# Patient Record
Sex: Female | Born: 1985 | Race: White | Hispanic: No | Marital: Married | Smoking: Never smoker
Health system: Southern US, Community
[De-identification: ages and names within clinical notes are randomized; demographics above are authoritative.]

## PROBLEM LIST (undated history)

## (undated) DIAGNOSIS — G4711 Idiopathic hypersomnia with long sleep time: Secondary | ICD-10-CM

## (undated) DIAGNOSIS — F32A Depression, unspecified: Secondary | ICD-10-CM

## (undated) DIAGNOSIS — T7840XA Allergy, unspecified, initial encounter: Secondary | ICD-10-CM

## (undated) DIAGNOSIS — F909 Attention-deficit hyperactivity disorder, unspecified type: Secondary | ICD-10-CM

## (undated) DIAGNOSIS — F329 Major depressive disorder, single episode, unspecified: Secondary | ICD-10-CM

## (undated) DIAGNOSIS — F419 Anxiety disorder, unspecified: Secondary | ICD-10-CM

## (undated) DIAGNOSIS — E282 Polycystic ovarian syndrome: Secondary | ICD-10-CM

## (undated) HISTORY — DX: Idiopathic hypersomnia with long sleep time: G47.11

## (undated) HISTORY — DX: Attention-deficit hyperactivity disorder, unspecified type: F90.9

## (undated) HISTORY — DX: Depression, unspecified: F32.A

## (undated) HISTORY — DX: Allergy, unspecified, initial encounter: T78.40XA

## (undated) HISTORY — DX: Polycystic ovarian syndrome: E28.2

## (undated) HISTORY — DX: Major depressive disorder, single episode, unspecified: F32.9

## (undated) HISTORY — DX: Anxiety disorder, unspecified: F41.9

---

## 2007-02-23 HISTORY — PX: APPENDECTOMY: SHX54

## 2008-05-22 HISTORY — PX: TONSILLECTOMY AND ADENOIDECTOMY: SHX28

## 2014-02-09 DIAGNOSIS — F429 Obsessive-compulsive disorder, unspecified: Secondary | ICD-10-CM | POA: Insufficient documentation

## 2016-04-21 NOTE — L&D Delivery Note (Addendum)
Delivery Note Pt pushed for just under an hour. At 1:10 PM a viable female was delivered via Vaginal, Spontaneous (Presentation:ROA ;  ).  APGAR: 8, 9; weight pending .   Placenta status:delivered, intact, duncan , .  Cord:3vc  with the following complications: nuchal x 1.  Cord pH: n/a  Anesthesia: Epidural  Episiotomy: None Lacerations: 1st degree Suture Repair: 2.0 vicryl Est. Blood Loss (mL): 200  Mom to postpartum.  Baby to Couplet care / Skin to Skin. Does not desire circumcision  Cathrine MusterCecilia W Harrison 04/11/2017, 1:36 PM

## 2016-07-31 DIAGNOSIS — Z31430 Encounter of female for testing for genetic disease carrier status for procreative management: Secondary | ICD-10-CM | POA: Diagnosis not present

## 2016-07-31 DIAGNOSIS — Z3161 Procreative counseling and advice using natural family planning: Secondary | ICD-10-CM | POA: Diagnosis not present

## 2016-07-31 DIAGNOSIS — E282 Polycystic ovarian syndrome: Secondary | ICD-10-CM | POA: Diagnosis not present

## 2016-08-12 DIAGNOSIS — Z3200 Encounter for pregnancy test, result unknown: Secondary | ICD-10-CM | POA: Diagnosis not present

## 2016-08-21 DIAGNOSIS — N939 Abnormal uterine and vaginal bleeding, unspecified: Secondary | ICD-10-CM | POA: Diagnosis not present

## 2016-08-21 DIAGNOSIS — O26859 Spotting complicating pregnancy, unspecified trimester: Secondary | ICD-10-CM | POA: Diagnosis not present

## 2016-09-05 DIAGNOSIS — O0900 Supervision of pregnancy with history of infertility, unspecified trimester: Secondary | ICD-10-CM | POA: Diagnosis not present

## 2016-09-05 DIAGNOSIS — R5383 Other fatigue: Secondary | ICD-10-CM | POA: Diagnosis not present

## 2016-09-24 DIAGNOSIS — O99345 Other mental disorders complicating the puerperium: Secondary | ICD-10-CM | POA: Diagnosis not present

## 2016-09-24 DIAGNOSIS — Z3689 Encounter for other specified antenatal screening: Secondary | ICD-10-CM | POA: Diagnosis not present

## 2016-09-24 DIAGNOSIS — Z3A10 10 weeks gestation of pregnancy: Secondary | ICD-10-CM | POA: Diagnosis not present

## 2016-09-24 DIAGNOSIS — Z113 Encounter for screening for infections with a predominantly sexual mode of transmission: Secondary | ICD-10-CM | POA: Diagnosis not present

## 2016-09-24 LAB — OB RESULTS CONSOLE RUBELLA ANTIBODY, IGM: RUBELLA: IMMUNE

## 2016-09-24 LAB — OB RESULTS CONSOLE HIV ANTIBODY (ROUTINE TESTING): HIV: NONREACTIVE

## 2016-09-24 LAB — OB RESULTS CONSOLE GC/CHLAMYDIA
Chlamydia: NEGATIVE
GC PROBE AMP, GENITAL: NEGATIVE

## 2016-09-24 LAB — OB RESULTS CONSOLE ANTIBODY SCREEN: ANTIBODY SCREEN: NEGATIVE

## 2016-09-24 LAB — OB RESULTS CONSOLE ABO/RH: RH Type: NEGATIVE

## 2016-09-24 LAB — OB RESULTS CONSOLE RPR: RPR: NONREACTIVE

## 2016-09-24 LAB — OB RESULTS CONSOLE HEPATITIS B SURFACE ANTIGEN: Hepatitis B Surface Ag: NEGATIVE

## 2016-10-07 DIAGNOSIS — Z3A12 12 weeks gestation of pregnancy: Secondary | ICD-10-CM | POA: Diagnosis not present

## 2016-10-07 DIAGNOSIS — Z3682 Encounter for antenatal screening for nuchal translucency: Secondary | ICD-10-CM | POA: Diagnosis not present

## 2016-10-07 DIAGNOSIS — O200 Threatened abortion: Secondary | ICD-10-CM | POA: Diagnosis not present

## 2016-10-07 MED FILL — FLUoxetine HCL 40 MG CAPS: 40 | 90 days supply | Qty: 90 | Fill #0

## 2016-10-30 ENCOUNTER — Ambulatory Visit: Payer: 59 | Attending: Obstetrics and Gynecology | Admitting: Physical Therapy

## 2016-10-30 DIAGNOSIS — R293 Abnormal posture: Secondary | ICD-10-CM | POA: Diagnosis not present

## 2016-10-30 DIAGNOSIS — M545 Low back pain: Secondary | ICD-10-CM | POA: Diagnosis not present

## 2016-10-30 DIAGNOSIS — M6281 Muscle weakness (generalized): Secondary | ICD-10-CM | POA: Insufficient documentation

## 2016-10-30 NOTE — Progress Notes (Signed)
Patient presents to clinic today to establish care.  Acute Concerns: Patient currently noting fatigue. Is [redacted] weeks pregnant currently and followed by OB. Is taking prenatal vitamins as directed. Is eating a well-balanced diet.  Notes she could hydrate better. Notes history of b12 and Vitamin D deficiency. Denies history of anemia. Would like assessment of these levels.   Chronic Issues: Depression -- Is currently on Prozac 40 mg daily. Has been on this regimen since 2014. Endorses symptoms are well-controlled on this regimen.  Denies SI/HI.   Health Maintenance: Immunizations -- Unsure of last Tetanus. Will be getting a TDaP in the next 2 weeks.  Mammogram -- PAP -- up-to-date. Patient is currently pregnant - 16 weeks. [place]N in [place]   Past Medical History:  Diagnosis Date  . ADHD (attention deficit hyperactivity disorder)   . Allergy   . Depression   . Hypersomnia, idiopathic    Past Surgical History:  Procedure Laterality Date  . APPENDECTOMY  02/23/2007  . TONSILLECTOMY AND ADENOIDECTOMY  05/22/2008   No current outpatient prescriptions on file prior to visit.   No current facility-administered medications on file prior to visit.    No Known Allergies  Family History  Problem Relation Age of Onset  . Hyperlipidemia Father   . Hypothyroidism Father   . Heart block Son   . Heart attack Maternal Uncle   . Heart attack Paternal Uncle   . Cancer Maternal Grandmother        Breast  . Cancer Maternal Grandfather        Stomach  . Hyperlipidemia Paternal Grandmother   . Diabetes Paternal Grandmother   . Heart block Paternal Grandmother   . Hypothyroidism Paternal Grandmother   . Cancer Paternal Grandfather        Kidney    Social History   Social History  . Marital status: Married    Spouse name: Trinna Postlex  . Number of children: N/A  . Years of education: N/A   Occupational History  . MD [employer]   Social History Main Topics  .  Smoking status: Never Smoker  . Smokeless tobacco: Never Used  . Alcohol use No  . Drug use: No  . Sexual activity: Yes   Other Topics Concern  . Not on file   Social History Narrative  . No narrative on file   Review of Systems  Constitutional: Positive for malaise/fatigue.  HENT: Negative for hearing loss.   Eyes: Negative for blurred vision and double vision.  Respiratory: Negative for cough and shortness of breath.   Cardiovascular: Negative for chest pain and palpitations.  Neurological: Negative for loss of consciousness.  Psychiatric/Behavioral: Positive for depression. Negative for hallucinations, substance abuse and suicidal ideas. The patient is not nervous/anxious and does not have insomnia.    BP (!) 100/52   Pulse 75   Temp 98.9 F (37.2 C) (Oral)   Resp 14   Ht 5\' 7"  (1.702 m)   Wt 128 lb (58.1 kg)   LMP 06/19/2016   SpO2 99%   BMI 20.05 kg/m   Physical Exam  Constitutional: She is oriented to person, place, and time and well-developed, well-nourished, and in no distress.  HENT:  Head: Normocephalic and atraumatic.  Eyes: Conjunctivae are normal.  Neck: Neck supple.  Cardiovascular: Normal rate, regular rhythm, normal heart sounds and intact distal pulses.   Pulmonary/Chest: Effort normal and breath sounds normal. No respiratory distress. She has no wheezes. She has no rales. She exhibits  no tenderness.  Neurological: She is alert and oriented to person, place, and time.  Skin: Skin is warm and dry. No rash noted.  Psychiatric: Affect normal.  Vitals reviewed.  Assessment/Plan: 1. Other fatigue Lab panel today to further assess. Continue care as directed by OB. - CBC - Iron, TIBC and Ferritin Panel - B12 - Vitamin D (25 hydroxy)  2. History of vitamin D deficiency Labs today to assess.    Piedad Climes, PA-C

## 2016-10-30 NOTE — Patient Instructions (Signed)
    Belly Breathing Transverse Abdominus Activation- Quadruped  On hands and knees with spine in neutral, take in a big breath allowing the belly to expand downward toward floor. As you exhale, feel the elastic recoil of your belly and follow it by drawing your belly up to your spine. Your spine should not move while performing.    HIP ADDUCTION SQUEEZE - SUPINE  Place a rolled up towel, ball or pillow between your knees and press your knees together so that you squeeze the object firmly, left pelvic floor and engage abdominals. Hold 5 sec and then release and repeat, 20x   Brace abdominals and march arms and legs     Press up to forearms or straight arms if comfortable - hold 10 sec repeat 5 x   Posture - Standing   Good posture is important. Avoid slouching and forward head thrust. Maintain curve in low back and align ears over shoulders, hips over ankles.  Pull your belly button in toward your back bone. Posture Tips DO: - stand tall and erect - keep chin tucked in - keep head and shoulders in alignment - check posture regularly in mirror or large window - pull head back against headrest in car seat;  Change your position often.  Sit with lumbar support. DON'T: - slouch or slump while watching TV or reading - sit, stand or lie in one position  for too long;  Sitting is especially hard on the spine so if you sit at a desk/use the computer, then stand up often! Copyright  VHI. All rights reserved.  Posture - Sitting  Sit upright, head facing forward. Try using a roll to support lower back. Keep shoulders relaxed, and avoid rounded back. Keep hips level with knees. Avoid crossing legs for long periods. Copyright  VHI. All rights reserved.  Chronic neck strain can develop because of poor posture and faulty work habits  Postural strain related to slumped sitting and forward head posture is a leading cause of headaches, neck and upper back pain  General strengthening and  flexibility exercises are helpful in the treatment of neck pain.  Most importantly, you should learn to correct the posture that may be contributing to chronic pain.   Change positions frequently  Change your work or home environment to improve posture and mechanics.

## 2016-10-31 ENCOUNTER — Encounter: Payer: Self-pay | Admitting: Physician Assistant

## 2016-10-31 ENCOUNTER — Other Ambulatory Visit (INDEPENDENT_AMBULATORY_CARE_PROVIDER_SITE_OTHER): Payer: 59

## 2016-10-31 ENCOUNTER — Ambulatory Visit (INDEPENDENT_AMBULATORY_CARE_PROVIDER_SITE_OTHER): Payer: 59 | Admitting: Physician Assistant

## 2016-10-31 ENCOUNTER — Encounter: Payer: Self-pay | Admitting: Physical Therapy

## 2016-10-31 VITALS — BP 100/52 | HR 75 | Temp 98.9°F | Resp 14 | Ht 67.0 in | Wt 128.0 lb

## 2016-10-31 DIAGNOSIS — Z8639 Personal history of other endocrine, nutritional and metabolic disease: Secondary | ICD-10-CM

## 2016-10-31 DIAGNOSIS — R5383 Other fatigue: Secondary | ICD-10-CM | POA: Diagnosis not present

## 2016-10-31 LAB — IBC PANEL
IRON: 65 ug/dL (ref 42–145)
SATURATION RATIOS: 18.6 % — AB (ref 20.0–50.0)
Transferrin: 250 mg/dL (ref 212.0–360.0)

## 2016-10-31 LAB — CBC
HEMATOCRIT: 35.1 % — AB (ref 36.0–46.0)
HEMOGLOBIN: 12 g/dL (ref 12.0–15.0)
MCHC: 34.1 g/dL (ref 30.0–36.0)
MCV: 88.1 fl (ref 78.0–100.0)
PLATELETS: 276 10*3/uL (ref 150.0–400.0)
RBC: 3.98 Mil/uL (ref 3.87–5.11)
RDW: 14.1 % (ref 11.5–15.5)
WBC: 11 10*3/uL — AB (ref 4.0–10.5)

## 2016-10-31 LAB — VITAMIN D 25 HYDROXY (VIT D DEFICIENCY, FRACTURES): VITD: 23.57 ng/mL — ABNORMAL LOW (ref 30.00–100.00)

## 2016-10-31 LAB — FERRITIN: FERRITIN: 47.1 ng/mL (ref 10.0–291.0)

## 2016-10-31 LAB — VITAMIN B12: Vitamin B-12: 372 pg/mL (ref 211–911)

## 2016-10-31 NOTE — Progress Notes (Signed)
Pre visit review using our clinic review tool, if applicable. No additional management support is needed unless otherwise documented below in the visit note. 

## 2016-10-31 NOTE — Therapy (Addendum)
[facility] [address], [address] , [place], [address] Phone: [phone]   Fax:  [phone]  Physical Therapy Evaluation  Patient Details  Name: Donna Harrison MRN: [id number] Date of Birth: 02/25/1986 Referring Provider: Banga, Cecilia Worema  Encounter Date: 10/30/2016      PT End of Session - 10/30/16 1128    Visit Number 1   Date for PT Re-Evaluation 02/20/17   PT Start Time 1143   PT Stop Time 1230   PT Time Calculation (min) 47 min   Activity Tolerance Patient tolerated treatment well      History reviewed. No pertinent past medical history.  History reviewed. No pertinent surgical history.  There were no vitals filed for this visit.       Subjective Assessment - 10/30/16 1151    Subjective Patient reports right buttock and SI region has been hurting.  Sitting on the floor makes it hurt a lot worse.  Pressure on right leg hurts a lot.  Pillow between legs at night seems to be helping.  Sometimes it gets flared up and I am not sure what I did. I did some stretches that did not bother me at the time, but the next couple days I couldn't walk without help from my husband.   Limitations Sitting   How long can you sit comfortably? hurts when getting up after sitting   Currently in Pain? Yes  9/10 worst   Pain Score 3    Pain Location Back   Pain Orientation Right   Pain Descriptors / Indicators Sharp   Pain Type Acute pain   Pain Onset More than a month ago   Pain Frequency Intermittent   Aggravating Factors  bending   Pain Relieving Factors sitting up and moving more   Effect of Pain on Daily Activities bending to reach for things            [facility] PT Assessment - 10/31/16 0001      Assessment   Medical Diagnosis M54.30 (ICD-10-CM) - Sciatica, unspecified side   Referring Provider Banga, Cecilia Worema   Onset Date/Surgical Date --  few weeks ago   Next MD Visit one month   Prior Therapy  No     Precautions   Precautions None     Restrictions   Weight Bearing Restrictions No     Balance Screen   Has the patient fallen in the past 6 months No     Home Environment   Living Environment Private residence   Living Arrangements Spouse/significant other     Prior Function   Level of Independence Independent   Vocation Full time employment   Vocation Requirements sitting     Cognition   Overall Cognitive Status Within Functional Limits for tasks assessed     Observation/Other Assessments   Focus on Therapeutic Outcomes (FOTO)  53% limited     Posture/Postural Control   Posture/Postural Control Postural limitations   Postural Limitations Anterior pelvic tilt;Left pelvic obliquity  sacral sitting     AROM   Overall AROM Comments WFL, lumbar flexion increases pain     Strength   Overall Strength Comments Right hip 4-/5; Left hip 4/5; core 3/5     Palpation   SI assessment  left hip anterior rotation, positive stork test on left   Palpation comment tightness of lumbar paraspinals and fascial restriction     Ambulation/Gait   Gait Pattern Within Functional Limits              Objective measurements completed on examination: See above findings.        Pelvic Floor Special Questions - 10/31/16 0001    Prior Pelvic/Prostate Exam Yes   Result Pelvic/Prostate Exam  Normal   Are you Pregnant or attempting pregnancy? Yes   Currently Sexually Active Yes   Is this Painful No   Marinoff Scale no problems   Urinary Leakage No   Urinary urgency No   Urinary frequency Slightly more frequent due to pregnancy   Fecal incontinence No   Falling out feeling (prolapse) No   External Perineal Exam No pelvic exam          [facility] Adult PT Treatment/Exercise - 10/31/16 0001      Self-Care   Self-Care Posture   Posture educated on improved sitting posture     Exercises   Exercises Other Exercises   Other Exercises  see HEP                PT  Education - 10/31/16 0959    Education provided Yes   Education Details posture, prone press up, ball squeeze, qped transverse ab, supine marching   Person(s) Educated Patient   Methods Explanation;Demonstration;Verbal cues;Handout   Comprehension Verbalized understanding;Returned demonstration          PT Short Term Goals - 10/31/16 1006      PT SHORT TERM GOAL #1   Title independent with initial HEP   Time 4   Period Weeks   Status New     PT SHORT TERM GOAL #2   Title able to centralize pain with exercise and positioning when it radiates   Time 4   Period Weeks   Status New     PT SHORT TERM GOAL #3   Title no flare ups of back pain for a full week of normal activities   Time 4   Period Weeks   Status New           PT Long Term Goals - 10/31/16 1009      PT LONG TERM GOAL #1   Title able to manage pain due to improved ability to engage abdominals   Time 16   Period Weeks   Status New     PT LONG TERM GOAL #2   Title reduce pain to 1/10 throughout full day of typical activities   Baseline 3/10   Time 16   Period Weeks   Status New     PT LONG TERM GOAL #3   Title independent with advanced HEP for continued strengthening to support increased abdominal weight gained during pregnancy   Time 16   Period Weeks   Status New     PT LONG TERM GOAL #4   Title pain with sit to stand reduced by 50%   Time 16   Period Weeks   Status New     PT LONG TERM GOAL #5   Title FOTO < or = to 32% limited   Time 16   Period Weeks   Status New                Plan - 10/31/16 1017    Clinical Impression Statement Patient presents to clinic with low back pain that has been radiating down front of right leg to the knee and 8/10 at worst.  Patient demonstrates core weakness 3/5, hip weakness Rt>Lt, pelvic obliquity left hip anterior rotation.  Pt had left pelvic instabilty with stork test.  Lumbar flexion causes pain   and pt has tightness in lumbar paraspinals and  facia.  Pt will benefit from skilled PT to address impairments and improved muscle strength and coordination for improved function throughout pregnancy.   History and Personal Factors relevant to plan of care: pregnant 09/16/16   Clinical Presentation Evolving   Clinical Presentation due to: patient is [redacted] weeks pregnant and will continue to gain abdominal weight   Clinical Decision Making Moderate   Rehab Potential Excellent   PT Frequency 1x / week   PT Duration Other (comment)  16 weeks   PT Treatment/Interventions ADLs/Self Care Home Management;Biofeedback;Electrical Stimulation;Cryotherapy;Moist Heat;Ultrasound;Patient/family education   PT Next Visit Plan assess leg length, correct anterior rotation of left hip, core and pelvic strength   Recommended Other Services n/a   Consulted and Agree with Plan of Care Patient      Patient will benefit from skilled therapeutic intervention in order to improve the following deficits and impairments:  Pain, Postural dysfunction, Increased muscle spasms, Increased fascial restricitons, Decreased strength  Visit Diagnosis: Acute right-sided low back pain, with sciatica presence unspecified  Muscle weakness (generalized)  Abnormal posture     Problem List There are no active problems to display for this patient.   Jakki Crosser, PT 10/31/2016, 11:47 AM  [facility] [address], [address] , [place], [address] Phone: [phone]   Fax:  [phone]  Name: Maahi A Myrie MRN: [id number] Date of Birth: 04/15/1986  PHYSICAL THERAPY DISCHARGE SUMMARY  Visits from Start of Care: 1  Current functional level related to goals / functional outcomes: Se above   Remaining deficits: See above 1x only   Education / Equipment: HEP  Plan: Patient agrees to discharge.  Patient goals were not met. Patient is being discharged due to being pleased with the current functional level.   ?????         Jakki Crosser, PT 12/04/16 7:59 AM  

## 2016-10-31 NOTE — Addendum Note (Signed)
Addended by: Con MemosMOORE, Messi Twedt S on: 10/31/2016 01:08 PM   Modules accepted: Orders

## 2016-10-31 NOTE — Patient Instructions (Signed)
Please continue current medications as directed by OB. Follow-up with them as scheduled. Continue prenatal vitamins. I will check labs today and call you with your results.  We will alter your regimen accordingly.

## 2016-11-20 ENCOUNTER — Telehealth: Payer: Self-pay | Admitting: Physical Therapy

## 2016-11-20 ENCOUNTER — Ambulatory Visit: Payer: 59 | Attending: Obstetrics and Gynecology | Admitting: Physical Therapy

## 2016-11-20 NOTE — Telephone Encounter (Signed)
Pt no showed and called realizing she forgot about appointment.  States she is feeling better.  She was reminded of upcoming appointment  Vincente PoliJakki Crosser, PT 11/20/16 10:39 AM

## 2016-12-04 ENCOUNTER — Encounter: Payer: 59 | Admitting: Physical Therapy

## 2016-12-18 ENCOUNTER — Encounter: Payer: 59 | Admitting: Physical Therapy

## 2017-03-19 LAB — OB RESULTS CONSOLE GBS: STREP GROUP B AG: NEGATIVE

## 2017-04-01 ENCOUNTER — Telehealth (HOSPITAL_COMMUNITY): Payer: Self-pay | Admitting: *Deleted

## 2017-04-01 ENCOUNTER — Encounter (HOSPITAL_COMMUNITY): Payer: Self-pay | Admitting: *Deleted

## 2017-04-01 NOTE — Telephone Encounter (Signed)
Preadmission screen  

## 2017-04-11 ENCOUNTER — Inpatient Hospital Stay (HOSPITAL_COMMUNITY)
Admission: AD | Admit: 2017-04-11 | Discharge: 2017-04-12 | DRG: 807 | Disposition: A | Discharge status: Home / Self Care | Payer: PRIVATE HEALTH INSURANCE | Source: Ambulatory Visit | Attending: Obstetrics and Gynecology | Admitting: Obstetrics and Gynecology

## 2017-04-11 ENCOUNTER — Inpatient Hospital Stay (HOSPITAL_COMMUNITY): Payer: PRIVATE HEALTH INSURANCE | Admitting: Anesthesiology

## 2017-04-11 ENCOUNTER — Encounter (HOSPITAL_COMMUNITY): Payer: Self-pay | Admitting: *Deleted

## 2017-04-11 DIAGNOSIS — F329 Major depressive disorder, single episode, unspecified: Secondary | ICD-10-CM | POA: Diagnosis present

## 2017-04-11 DIAGNOSIS — R030 Elevated blood-pressure reading, without diagnosis of hypertension: Secondary | ICD-10-CM | POA: Diagnosis present

## 2017-04-11 DIAGNOSIS — Z3A39 39 weeks gestation of pregnancy: Secondary | ICD-10-CM | POA: Diagnosis not present

## 2017-04-11 DIAGNOSIS — Z3483 Encounter for supervision of other normal pregnancy, third trimester: Secondary | ICD-10-CM | POA: Diagnosis present

## 2017-04-11 DIAGNOSIS — O26893 Other specified pregnancy related conditions, third trimester: Secondary | ICD-10-CM | POA: Diagnosis present

## 2017-04-11 DIAGNOSIS — O9989 Other specified diseases and conditions complicating pregnancy, childbirth and the puerperium: Secondary | ICD-10-CM

## 2017-04-11 DIAGNOSIS — O99344 Other mental disorders complicating childbirth: Secondary | ICD-10-CM | POA: Diagnosis present

## 2017-04-11 LAB — PROTEIN / CREATININE RATIO, URINE
Creatinine, Urine: 76 mg/dL
Protein Creatinine Ratio: 0.21 mg/mg{Cre} — ABNORMAL HIGH (ref 0.00–0.15)
Total Protein, Urine: 16 mg/dL

## 2017-04-11 LAB — COMPREHENSIVE METABOLIC PANEL
ALBUMIN: 3 g/dL — AB (ref 3.5–5.0)
ALK PHOS: 196 U/L — AB (ref 38–126)
ALT: 12 U/L — ABNORMAL LOW (ref 14–54)
AST: 24 U/L (ref 15–41)
Anion gap: 11 (ref 5–15)
BILIRUBIN TOTAL: 0.5 mg/dL (ref 0.3–1.2)
BUN: 12 mg/dL (ref 6–20)
CALCIUM: 8.8 mg/dL — AB (ref 8.9–10.3)
CO2: 21 mmol/L — ABNORMAL LOW (ref 22–32)
Chloride: 100 mmol/L — ABNORMAL LOW (ref 101–111)
Creatinine, Ser: 0.77 mg/dL (ref 0.44–1.00)
GFR calc Af Amer: >60 mL/min (ref >60)
GFR calc non Af Amer: >60 mL/min (ref >60)
GLUCOSE: 90 mg/dL (ref 65–99)
Potassium: 4.7 mmol/L (ref 3.5–5.1)
Sodium: 132 mmol/L — ABNORMAL LOW (ref 135–145)
TOTAL PROTEIN: 6.1 g/dL — AB (ref 6.5–8.1)

## 2017-04-11 LAB — CBC
HEMATOCRIT: 36.9 % (ref 36.0–46.0)
HEMOGLOBIN: 12.6 g/dL (ref 12.0–15.0)
MCH: 29.3 pg (ref 26.0–34.0)
MCHC: 34.1 g/dL (ref 30.0–36.0)
MCV: 85.8 fL (ref 78.0–100.0)
Platelets: 229 10*3/uL (ref 150–400)
RBC: 4.3 MIL/uL (ref 3.87–5.11)
RDW: 14 % (ref 11.5–15.5)
WBC: 16.2 10*3/uL — AB (ref 4.0–10.5)

## 2017-04-11 LAB — URIC ACID: Uric Acid, Serum: 6.1 mg/dL (ref 2.3–6.6)

## 2017-04-11 LAB — LACTATE DEHYDROGENASE: LDH: 208 U/L — ABNORMAL HIGH (ref 98–192)

## 2017-04-11 LAB — TYPE AND SCREEN
ABO/RH(D): O NEG
ANTIBODY SCREEN: NEGATIVE

## 2017-04-11 LAB — RPR: RPR: NONREACTIVE

## 2017-04-11 LAB — ABO/RH: ABO/RH(D): O NEG

## 2017-04-11 MED ORDER — IBUPROFEN 600 MG PO TABS
600.0000 mg | ORAL_TABLET | Freq: Four times a day (QID) | ORAL | Status: DC
  Administered 2017-04-11 – 2017-04-12 (×3): 600 mg via ORAL
  Filled 2017-04-11 (×3): qty 1

## 2017-04-11 MED ORDER — ACETAMINOPHEN 325 MG PO TABS: 650.0000 mg | ORAL_TABLET | ORAL | Status: DC | PRN

## 2017-04-11 MED ORDER — OXYTOCIN 40 UNITS IN LACTATED RINGERS INFUSION - SIMPLE MED
2.5000 [IU]/h | INTRAVENOUS | Status: DC
  Filled 2017-04-11: qty 1000

## 2017-04-11 MED ORDER — ZOLPIDEM TARTRATE 5 MG PO TABS: 5.0000 mg | ORAL_TABLET | Freq: Every evening | ORAL | Status: DC | PRN

## 2017-04-11 MED ORDER — WITCH HAZEL-GLYCERIN EX PADS: 1.0000 "application " | MEDICATED_PAD | CUTANEOUS | Status: DC | PRN

## 2017-04-11 MED ORDER — ONDANSETRON HCL 4 MG/2ML IJ SOLN: 4.0000 mg | INTRAMUSCULAR | Status: DC | PRN

## 2017-04-11 MED ORDER — LACTATED RINGERS IV SOLN
INTRAVENOUS | Status: DC
  Administered 2017-04-11: 05:00:00 via INTRAVENOUS

## 2017-04-11 MED ORDER — ONDANSETRON HCL 4 MG PO TABS: 4.0000 mg | ORAL_TABLET | ORAL | Status: DC | PRN

## 2017-04-11 MED ORDER — TETANUS-DIPHTH-ACELL PERTUSSIS 5-2.5-18.5 LF-MCG/0.5 IM SUSP
0.5000 mL | Freq: Once | INTRAMUSCULAR | Status: DC
  Filled 2017-04-11: qty 0.5

## 2017-04-11 MED ORDER — EPHEDRINE 5 MG/ML INJ: 10.0000 mg | INTRAVENOUS | Status: DC | PRN

## 2017-04-11 MED ORDER — BUTORPHANOL TARTRATE 1 MG/ML IJ SOLN
INTRAMUSCULAR | Status: AC
  Filled 2017-04-11: qty 1

## 2017-04-11 MED ORDER — PHENYLEPHRINE 40 MCG/ML (10ML) SYRINGE FOR IV PUSH (FOR BLOOD PRESSURE SUPPORT): 80.0000 ug | PREFILLED_SYRINGE | INTRAVENOUS | Status: DC | PRN

## 2017-04-11 MED ORDER — COCONUT OIL OIL
1.0000 "application " | TOPICAL_OIL | Status: DC | PRN
  Administered 2017-04-12: 1 via TOPICAL
  Filled 2017-04-11 (×2): qty 120

## 2017-04-11 MED ORDER — SIMETHICONE 80 MG PO CHEW: 80.0000 mg | CHEWABLE_TABLET | ORAL | Status: DC | PRN

## 2017-04-11 MED ORDER — SOD CITRATE-CITRIC ACID 500-334 MG/5ML PO SOLN: 30.0000 mL | ORAL | Status: DC | PRN

## 2017-04-11 MED ORDER — FLUOXETINE HCL 20 MG PO CAPS
40.0000 mg | ORAL_CAPSULE | Freq: Every day | ORAL | Status: DC
  Administered 2017-04-11 – 2017-04-12 (×2): 40 mg via ORAL
  Filled 2017-04-11 (×3): qty 2

## 2017-04-11 MED ORDER — OXYCODONE HCL 5 MG PO TABS: 10.0000 mg | ORAL_TABLET | ORAL | Status: DC | PRN

## 2017-04-11 MED ORDER — DIBUCAINE 1 % RE OINT
1.0000 "application " | TOPICAL_OINTMENT | RECTAL | Status: DC | PRN
  Filled 2017-04-11: qty 28

## 2017-04-11 MED ORDER — FENTANYL 2.5 MCG/ML BUPIVACAINE 1/10 % EPIDURAL INFUSION (WH - ANES)
INTRAMUSCULAR | Status: AC
  Filled 2017-04-11: qty 100

## 2017-04-11 MED ORDER — LIDOCAINE HCL (PF) 1 % IJ SOLN
INTRAMUSCULAR | Status: DC | PRN
  Administered 2017-04-11 (×2): 4 mL via EPIDURAL

## 2017-04-11 MED ORDER — ONDANSETRON HCL 4 MG/2ML IJ SOLN: 4.0000 mg | Freq: Four times a day (QID) | INTRAMUSCULAR | Status: DC | PRN

## 2017-04-11 MED ORDER — PRENATAL MULTIVITAMIN CH
1.0000 | ORAL_TABLET | Freq: Every day | ORAL | Status: DC
  Administered 2017-04-11 – 2017-04-12 (×2): 1 via ORAL
  Filled 2017-04-11 (×2): qty 1

## 2017-04-11 MED ORDER — FENTANYL 2.5 MCG/ML BUPIVACAINE 1/10 % EPIDURAL INFUSION (WH - ANES)
14.0000 mL/h | INTRAMUSCULAR | Status: DC | PRN
  Administered 2017-04-11: 14 mL/h via EPIDURAL

## 2017-04-11 MED ORDER — PHENYLEPHRINE 40 MCG/ML (10ML) SYRINGE FOR IV PUSH (FOR BLOOD PRESSURE SUPPORT)
PREFILLED_SYRINGE | INTRAVENOUS | Status: AC
  Filled 2017-04-11: qty 10

## 2017-04-11 MED ORDER — OXYCODONE HCL 5 MG PO TABS: 5.0000 mg | ORAL_TABLET | ORAL | Status: DC | PRN

## 2017-04-11 MED ORDER — FLEET ENEMA 7-19 GM/118ML RE ENEM: 1.0000 | ENEMA | RECTAL | Status: DC | PRN

## 2017-04-11 MED ORDER — DIPHENHYDRAMINE HCL 50 MG/ML IJ SOLN: 12.5000 mg | INTRAMUSCULAR | Status: DC | PRN

## 2017-04-11 MED ORDER — BENZOCAINE-MENTHOL 20-0.5 % EX AERO
1.0000 "application " | INHALATION_SPRAY | CUTANEOUS | Status: DC | PRN
  Administered 2017-04-12: 1 via TOPICAL
  Filled 2017-04-11 (×2): qty 56

## 2017-04-11 MED ORDER — SENNOSIDES-DOCUSATE SODIUM 8.6-50 MG PO TABS
2.0000 | ORAL_TABLET | ORAL | Status: DC
  Administered 2017-04-12: 2 via ORAL
  Filled 2017-04-11: qty 2

## 2017-04-11 MED ORDER — LACTATED RINGERS IV SOLN
500.0000 mL | Freq: Once | INTRAVENOUS | Status: AC
  Administered 2017-04-11: 500 mL via INTRAVENOUS

## 2017-04-11 MED ORDER — OXYTOCIN BOLUS FROM INFUSION
500.0000 mL | Freq: Once | INTRAVENOUS | Status: AC
  Administered 2017-04-11: 500 mL via INTRAVENOUS

## 2017-04-11 MED ORDER — OXYCODONE-ACETAMINOPHEN 5-325 MG PO TABS: 2.0000 | ORAL_TABLET | ORAL | Status: DC | PRN

## 2017-04-11 MED ORDER — DIPHENHYDRAMINE HCL 25 MG PO CAPS: 25.0000 mg | ORAL_CAPSULE | Freq: Four times a day (QID) | ORAL | Status: DC | PRN

## 2017-04-11 MED ORDER — LACTATED RINGERS IV SOLN: 500.0000 mL | INTRAVENOUS | Status: DC | PRN

## 2017-04-11 MED ORDER — LIDOCAINE HCL (PF) 1 % IJ SOLN
30.0000 mL | INTRAMUSCULAR | Status: DC | PRN
  Filled 2017-04-11: qty 30

## 2017-04-11 MED ORDER — BUTORPHANOL TARTRATE 1 MG/ML IJ SOLN
1.0000 mg | INTRAMUSCULAR | Status: DC | PRN
  Administered 2017-04-11: 1 mg via INTRAVENOUS

## 2017-04-11 MED ORDER — OXYCODONE-ACETAMINOPHEN 5-325 MG PO TABS: 1.0000 | ORAL_TABLET | ORAL | Status: DC | PRN

## 2017-04-11 NOTE — MAU Provider Note (Signed)
Chief Complaint:  Contractions   First Provider Initiated Contact with Patient 04/11/17 0253     HPI  HPI: Donna Harrison is a 31 y.o. G1P0000 at [id number] presents to maternity admissions reporting contractions. I was asked to evaluate patient d/t elevated BPs. Denies history of hypertension. Denies headache, visual disturbance, or epigastric pain. Positive fetal movement.   Past Medical History: Past Medical History:  Diagnosis Date  . ADHD (attention deficit hyperactivity disorder)   . Allergy   . Anxiety   . Depression    prozac 40mg   . Hypersomnia, idiopathic   . PCOS (polycystic ovarian syndrome)    fertility specialist.     Past obstetric history: OB History  Gravida Para Term Preterm AB Living  1 0 0 0 0    SAB TAB Ectopic Multiple Live Births  0 0 0        # Outcome Date GA Lbr Len/2nd Weight Sex Delivery Anes PTL Lv  1 Current               Past Surgical History: Past Surgical History:  Procedure Laterality Date  . APPENDECTOMY  02/23/2007  . TONSILLECTOMY AND ADENOIDECTOMY  05/22/2008    Family History: Family History  Problem Relation Age of Onset  . Healthy Mother   . Hyperlipidemia Father   . Hypothyroidism Father   . Healthy Brother   . Heart attack Maternal Uncle 50  . Heart attack Paternal Uncle 45       Very inactive lifestyle, positive smoking history.   . Cancer Maternal Grandmother        Breast - postmenopausal  . Cancer Maternal Grandfather        Stomach  . Hyperlipidemia Paternal Grandmother   . Diabetes Paternal Grandmother   . Heart block Paternal Grandmother   . Hypothyroidism Paternal Grandmother   . Cancer Paternal Grandfather        Kidney    Social History: Social History   Tobacco Use  . Smoking status: Never Smoker  . Smokeless tobacco: Never Used  Substance Use Topics  . Alcohol use: No  . Drug use: No    Allergies: No Known Allergies  Meds:  Medications Prior to Admission  Medication Sig Dispense Refill  Last Dose  . FLUoxetine (PROZAC) 40 MG capsule Take 40 mg by mouth daily.   Taking  . FLUoxetine (PROZAC) 40 MG capsule Take 40 mg by mouth daily.     . Prenatal Vit-Fe Fumarate-FA (PRENATAL VITAMIN AND MINERAL PO) Take 1 tablet by mouth daily.   Taking    I have reviewed patient's Past Medical Hx, Surgical Hx, Family Hx, Social Hx, medications and allergies.   ROS:  Review of Systems  Constitutional: Negative.   Eyes: Negative for visual disturbance.  Gastrointestinal: Positive for abdominal pain.  Genitourinary: Positive for vaginal bleeding.  Neurological: Negative for headaches.   Other systems negative  Physical Exam   Patient Vitals for the past 24 hrs:  BP Temp Temp src Pulse Resp SpO2 Height Weight  04/11/17 0326 135/75 - - 61 - - - -  04/11/17 0325 - - - - - 100 % - -  04/11/17 0320 - - - - - 97 % - -  04/11/17 0316 - - - - - 100 % - -  04/11/17 0314 - - - - - 95 % - -  04/11/17 0313 - - - - - 98 % - -  04/11/17 0312 131/74 - - (!) 58 -  100 % - -  04/11/17 0311 - - - - - 100 % - -  04/11/17 0301 (!) 139/95 - - 61 - - - -  04/11/17 0256 137/80 - - 66 - - - -  04/11/17 0244 (!) 142/77 - - (!) 54 - - - -  04/11/17 0242 (!) 146/78 - - (!) 58 - - - -  04/11/17 0224 128/76 - - (!) 55 - 100 % - -  04/11/17 0221 (!) 142/79 (!) 97.1 F (36.2 C) Oral (!) 56 18 - 5\' 7"  (1.702 m) 158 lb (71.7 kg)   Constitutional: Well-developed, well-nourished. Rocking back & forth in bed breathing through contractions Cardiovascular: normal rate and rhythm Respiratory: normal effort, clear to auscultation bilaterally GI: Abd soft, non-tender, gravid appropriate for gestational age.   No rebound or guarding. Ctx palpate moderate with adequate resting tone MS: Extremities nontender, no edema, normal ROM Neurologic: Alert and oriented x 4. Bilateral patellar DTR 2+ & no clonus  FHT:  Baseline 120 , moderate variability, accelerations present, ?decel vs MHR tracing Contractions: q 2-5 mins    Labs: Results for orders placed or performed during the hospital encounter of 04/11/17 (from the past 24 hour(s))  Protein / creatinine ratio, urine     Status: Abnormal   Collection Time: 04/11/17  3:00 AM  Result Value Ref Range   Creatinine, Urine 76.00 mg/dL   Total Protein, Urine 16 mg/dL   Protein Creatinine Ratio 0.21 (H) 0.00 - 0.15 mg/mg[Cre]  CBC     Status: Abnormal   Collection Time: 04/11/17  3:13 AM  Result Value Ref Range   WBC 16.2 (H) 4.0 - 10.5 K/uL   RBC 4.30 3.87 - 5.11 MIL/uL   Hemoglobin 12.6 12.0 - 15.0 g/dL   HCT 16.1 09.6 - 04.5 %   MCV 85.8 78.0 - 100.0 fL   MCH 29.3 26.0 - 34.0 pg   MCHC 34.1 30.0 - 36.0 g/dL   RDW 40.9 81.1 - 91.4 %   Platelets 229 150 - 400 K/uL  Comprehensive metabolic panel     Status: Abnormal   Collection Time: 04/11/17  3:13 AM  Result Value Ref Range   Sodium 132 (L) 135 - 145 mmol/L   Potassium 4.7 3.5 - 5.1 mmol/L   Chloride 100 (L) 101 - 111 mmol/L   CO2 21 (L) 22 - 32 mmol/L   Glucose, Bld 90 65 - 99 mg/dL   BUN 12 6 - 20 mg/dL   Creatinine, Ser 7.82 0.44 - 1.00 mg/dL   Calcium 8.8 (L) 8.9 - 10.3 mg/dL   Total Protein 6.1 (L) 6.5 - 8.1 g/dL   Albumin 3.0 (L) 3.5 - 5.0 g/dL   AST 24 15 - 41 U/L   ALT 12 (L) 14 - 54 U/L   Alkaline Phosphatase 196 (H) 38 - 126 U/L   Total Bilirubin 0.5 0.3 - 1.2 mg/dL   GFR calc non Af Amer >60 >60 mL/min   GFR calc Af Amer >60 >60 mL/min   Anion gap 11 5 - 15  Lactate dehydrogenase     Status: Abnormal   Collection Time: 04/11/17  3:13 AM  Result Value Ref Range   LDH 208 (H) 98 - 192 U/L  Uric acid     Status: None   Collection Time: 04/11/17  3:13 AM  Result Value Ref Range   Uric Acid, Serum 6.1 2.3 - 6.6 mg/dL   O/Negative/-- (95/62 0000)  Imaging:  No results found.  MAU Course/MDM: Will monitor BPs; none severe range Labs pending While labs pending, patient made cervical change   Assessment: Active labor [redacted] weeks gestation Elevated BP without diagnosis of  hypertension  Plan: RN spoke with Dr. Mindi SlickerBanga d/t cervical change Will admit patient  Judeth Hornrin Eriberto Felch, [facility] LL[facility]NP 04/11/2017 2:53 AM

## 2017-04-11 NOTE — MAU Note (Addendum)
Contractions for 6hrs. Some bloody show and diarrhea

## 2017-04-11 NOTE — Progress Notes (Signed)
Patient ID: Donna Harrison, female   DOB: 06/20/1985, 31 y.o.   MRN: [id number]  Complete dilated and +1 station Will start pushing Anticipate svd

## 2017-04-11 NOTE — Anesthesia Pain Management Evaluation Note (Signed)
  [facility] Pain Management Visit Note  Patient: Donna Harrison, 31 y.o., female  "Hello I am a member of the anesthesia team at [facility]. We have an anesthesia team available at all times to provide care throughout the hospital, including epidural management and anesthesia for C-section. I don't know your plan for the delivery whether it a natural birth, water birth, IV sedation, nitrous supplementation, doula or epidural, but we want to meet your pain goals."   1.Was your pain managed to your expectations on prior hospitalizations?   No prior hospitalizations  2.What is your expectation for pain management during this hospitalization?     Epidural  3.How can we help you reach that goal?   Record the patient's initial score and the patient's pain goal.   Pain: 0  Pain Goal: 4 [facility] wants you to be able to say your pain was always managed very well.  [name],Telecia Larocque Hristova 04/11/2017

## 2017-04-11 NOTE — Lactation Note (Signed)
This note was copied from a baby's chart. Lactation Consultation Note  Patient Name: Donna Harrison ZOXWR'UToday's Date: 04/11/2017 Reason for consult: Initial assessment;Primapara;Term Breastfeeding consultation services and support information given and reviewed.  This is mom's first baby and newborn is 417 hours old.  Mom concerned because baby desires to eat often.  Reassured and discussed normal newborn behavior.  Basic teaching done and questions answered.  Assisted with positioning the baby skin to skin in football hold.  Baby opened wide and latched easily with good depth.  Instructed to massage breast during feeding.  Nipple did look pinched when baby came off.  Mom has a small blister on the tip of the left nipple.  RN will provide coconut oil.  Mom knows how to hand express.  Parents would like an early discharge tomorrow.  Instructed to feed with cues and call out for assist prn.  Maternal Data Has patient been taught Hand Expression?: Yes Does the patient have breastfeeding experience prior to this delivery?: No  Feeding Feeding Type: Breast Fed Length of feed: 30 min  LATCH Score Latch: Grasps breast easily, tongue down, lips flanged, rhythmical sucking.  Audible Swallowing: A few with stimulation  Type of Nipple: Everted at rest and after stimulation  Comfort (Breast/Nipple): Filling, red/small blisters or bruises, mild/mod discomfort  Hold (Positioning): Assistance needed to correctly position infant at breast and maintain latch.  LATCH Score: 7  Interventions Interventions: Breast feeding basics reviewed;Assisted with latch;Breast compression;Skin to skin;Adjust position;Breast massage;Support pillows;Hand express;Position options;Coconut oil  Lactation Tools Discussed/Used     Consult Status Consult Status: Follow-up Date: 04/12/17 Follow-up type: In-patient    Huston FoleyMOULDEN, Alahia Whicker S 04/11/2017, 8:50 PM

## 2017-04-11 NOTE — H&P (Signed)
Donna Harrison is a 31 y.o. female who presented with painful contractions this am. Pt made cervical change in first hour - admitted for active labor. She is dated by 6week US. Had some prenatal testing done by Dr Jeannie FendY at beginning of pregnancy- all wnl. GBS neg  OB History    Gravida Para Term Preterm AB Living   1 0 0 0 0     SAB TAB Ectopic Multiple Live Births   0 0 0         Past Medical History:  Diagnosis Date  . ADHD (attention deficit hyperactivity disorder)   . Allergy   . Anxiety   . Depression    prozac 40mg   . Hypersomnia, idiopathic   . PCOS (polycystic ovarian syndrome)    fertility specialist.    Past Surgical History:  Procedure Laterality Date  . APPENDECTOMY  02/23/2007  . TONSILLECTOMY AND ADENOIDECTOMY  05/22/2008   Family History: family history includes Cancer in her maternal grandfather, maternal grandmother, and paternal grandfather; Diabetes in her paternal grandmother; Healthy in her brother and mother; Heart attack (age of onset: 6245) in her paternal uncle; Heart attack (age of onset: 3150) in her maternal uncle; Heart block in her paternal grandmother; Hyperlipidemia in her father and paternal grandmother; Hypothyroidism in her father and paternal grandmother. Social History:  reports that  has never smoked. she has never used smokeless tobacco. She reports that she does not drink alcohol or use drugs.     Maternal Diabetes: No Genetic Screening: Normal Maternal Ultrasounds/Referrals: Normal Fetal Ultrasounds or other Referrals:  None Maternal Substance Abuse:  No Significant Maternal Medications:  Meds include: Prozac Significant Maternal Lab Results:  Lab values include: Group B Strep negative Other Comments:  None  Review of Systems  Constitutional: Negative for chills, fever, malaise/fatigue and weight loss.  Eyes: Negative for blurred vision and double vision.  Respiratory: Negative for shortness of breath.   Cardiovascular: Negative for chest  pain.  Gastrointestinal: Negative for abdominal pain, heartburn, nausea and vomiting.  Genitourinary: Negative for dysuria.  Musculoskeletal: Negative for myalgias.  Skin: Negative for itching and rash.  Neurological: Negative for dizziness and headaches.  Psychiatric/Behavioral: Negative for depression, hallucinations, substance abuse and suicidal ideas. The patient is nervous/anxious.    Maternal Medical History:  Reason for admission: Contractions.  Nausea.  Contractions: Onset was 6-12 hours ago.   Frequency: regular.   Perceived severity is strong.    Fetal activity: Perceived fetal activity is normal.   Last perceived fetal movement was within the past hour.    Prenatal complications: no prenatal complications Prenatal Complications - Diabetes: none.    Dilation: 6 Effacement (%): 90 Station: -1 Exam by:: Milus GlazierJennifer Hamilton RN Blood pressure 114/74, pulse 66, temperature (!) 97.5 F (36.4 C), temperature source Oral, resp. rate 16, height 5\' 7"  (1.702 m), weight 158 lb (71.7 kg), last menstrual period 06/19/2016, SpO2 100 %. Maternal Exam:  Uterine Assessment: Contraction strength is moderate.  Contraction frequency is regular.   Abdomen: Patient reports no abdominal tenderness. Estimated fetal weight is AGA.   Fetal presentation: vertex  Introitus: Normal vulva. Vulva is negative for condylomata and lesion.  Normal vagina.  Vagina is negative for condylomata.  Amniotic fluid character: clear.  Pelvis: adequate for delivery.   Cervix: Cervix evaluated by digital exam.     Physical Exam  Constitutional: She is oriented to person, place, and time. She appears well-developed and well-nourished.  Neck: Normal range of motion.  Cardiovascular:  Normal rate.  Respiratory: Effort normal.  GI: Soft.  Genitourinary: Vagina normal and uterus normal. Vulva exhibits no lesion.  Musculoskeletal: Normal range of motion.  Neurological: She is alert and oriented to person,  place, and time.  Skin: Skin is warm.  Psychiatric: She has a normal mood and affect. Her behavior is normal. Judgment and thought content normal.    Prenatal labs: ABO, Rh: --/--/O NEG (12/22 0500) Antibody: NEG (12/22 0500) Rubella: Immune (06/06 0000) RPR: Nonreactive (06/06 0000)  HBsAg: Negative (06/06 0000)  HIV: Non-reactive (06/06 0000)  GBS: Negative (11/29 0000)   Assessment/Plan: 31yo G1P0 at 39 1/[redacted]wks gestation in active labor AROM performed with moderate return of clear fluid Cat 1 strip Comfortable with epidural Anticipate svd  Donna Harrison 04/11/2017, 10:27 AM

## 2017-04-11 NOTE — Anesthesia Procedure Notes (Signed)
Epidural Patient location during procedure: OB Start time: 04/11/2017 6:15 AM  Staffing Anesthesiologist: Leonides GrillsEllender, Abrish Erny P, MD Performed: anesthesiologist   Preanesthetic Checklist Completed: patient identified, site marked, pre-op evaluation, timeout performed, IV checked, risks and benefits discussed and monitors and equipment checked  Epidural Patient position: sitting Prep: DuraPrep Patient monitoring: heart rate, cardiac monitor, continuous pulse ox and blood pressure Approach: midline Location: L3-L4 Injection technique: LOR air  Needle:  Needle type: Tuohy  Needle gauge: 17 G Needle length: 9 cm Needle insertion depth: 5 cm Catheter type: closed end flexible Catheter size: 19 Gauge Catheter at skin depth: 10 cm Test dose: negative and Other  Assessment Events: blood not aspirated, injection not painful, no injection resistance and negative IV test  Additional Notes Informed consent obtained prior to proceeding including risk of failure, 1% risk of PDPH, risk of minor discomfort and bruising. Discussed alternatives to epidural analgesia and patient desires to proceed.  Timeout performed pre-procedure verifying patient name, procedure, and platelet count.  Patient tolerated procedure well. Reason for block:procedure for pain

## 2017-04-11 NOTE — Anesthesia Preprocedure Evaluation (Signed)
Anesthesia Evaluation  Patient identified by MRN, date of birth, ID band Patient awake    Reviewed: Allergy & Precautions, H&P , NPO status , Patient's Chart, lab work & pertinent test results  History of Anesthesia Complications Negative for: history of anesthetic complications  Airway Mallampati: II  TM Distance: >3 FB Neck ROM: full    Dental no notable dental hx. (+) Teeth Intact   Pulmonary neg pulmonary ROS,    Pulmonary exam normal breath sounds clear to auscultation       Cardiovascular negative cardio ROS Normal cardiovascular exam Rhythm:regular Rate:Normal     Neuro/Psych PSYCHIATRIC DISORDERS Anxiety Depression ADHD (attention deficit hyperactivity disorder)negative neurological ROS     GI/Hepatic negative GI ROS, Neg liver ROS,   Endo/Other  negative endocrine ROS  Renal/GU negative Renal ROS  negative genitourinary   Musculoskeletal   Abdominal   Peds  Hematology negative hematology ROS (+)   Anesthesia Other Findings   Reproductive/Obstetrics (+) Pregnancy                             Anesthesia Physical Anesthesia Plan  ASA: II  Anesthesia Plan: Epidural   Post-op Pain Management:    Induction:   PONV Risk Score and Plan:   Airway Management Planned:   Additional Equipment:   Intra-op Plan:   Post-operative Plan:   Informed Consent: I have reviewed the patients History and Physical, chart, labs and discussed the procedure including the risks, benefits and alternatives for the proposed anesthesia with the patient or authorized representative who has indicated his/her understanding and acceptance.     Plan Discussed with:   Anesthesia Plan Comments:         Anesthesia Quick Evaluation

## 2017-04-12 LAB — CBC
HEMATOCRIT: 35.9 % — AB (ref 36.0–46.0)
HEMOGLOBIN: 12.4 g/dL (ref 12.0–15.0)
MCH: 30 pg (ref 26.0–34.0)
MCHC: 34.5 g/dL (ref 30.0–36.0)
MCV: 86.9 fL (ref 78.0–100.0)
Platelets: 199 10*3/uL (ref 150–400)
RBC: 4.13 MIL/uL (ref 3.87–5.11)
RDW: 14.2 % (ref 11.5–15.5)
WBC: 16.8 10*3/uL — ABNORMAL HIGH (ref 4.0–10.5)

## 2017-04-12 MED ORDER — RHO D IMMUNE GLOBULIN 1500 UNIT/2ML IJ SOSY
300.0000 ug | PREFILLED_SYRINGE | Freq: Once | INTRAMUSCULAR | Status: AC
  Administered 2017-04-12: 300 ug via INTRAMUSCULAR
  Filled 2017-04-12: qty 2

## 2017-04-12 NOTE — Discharge Summary (Signed)
OB Discharge Summary     Patient Name: Donna Harrison DOB: 02/23/1986 MRN: [id number]  Date of admission: 04/11/2017 Delivering MD: Pryor OchoaBANGA, CECILIA [facility]   Date of discharge: 04/12/2017  Admitting diagnosis: 39 wks labor Intrauterine pregnancy: 7032w1d     Secondary diagnosis:  Active Problems:   Normal labor   SVD (spontaneous vaginal delivery)   Postpartum care following vaginal delivery      Discharge diagnosis: Term Pregnancy Delivered            Hospital course:  Onset of Labor With Vaginal Delivery     31 y.o. yo G1P1001 at 332w1d was admitted in Active Labor on 04/11/2017. Patient had an uncomplicated labor course as follows:  Membrane Rupture Time/Date: 10:19 AM ,04/11/2017   Intrapartum Procedures: Episiotomy: None [1]                                         Lacerations:  1st degree [2]  Patient had a delivery of a Viable infant. 04/11/2017  Information for the patient's newborn:  Phillips Hayksir, Boy Christle [[id number]][[id number]]       Pateint had an uncomplicated postpartum course.  She is ambulating, tolerating a regular diet, passing flatus, and urinating well. Patient is discharged home in stable condition on 04/12/17.   Physical exam  Vitals:   04/11/17 1448 04/11/17 1548 04/11/17 2015 04/12/17 0349  BP: 118/69 118/70 111/66 120/73  Pulse: 69 64 66 65  Resp: 16 18 18    Temp: 98.4 F (36.9 C) 98.3 F (36.8 C) 98.2 F (36.8 C) 98.1 F (36.7 C)  TempSrc: Oral Oral Oral Oral  SpO2: 100% 99% 99% 100%  Weight:      Height:       General: alert Lochia: appropriate Uterine Fundus: firm  Labs: Lab Results  Component Value Date   WBC 16.8 (H) 04/12/2017   HGB 12.4 04/12/2017   HCT 35.9 (L) 04/12/2017   MCV 86.9 04/12/2017   PLT 199 04/12/2017   CMP Latest Ref Rng & Units 04/11/2017  Glucose 65 - 99 mg/dL 90  BUN 6 - 20 mg/dL 12  Creatinine 7.820.44 - 9.561.00 mg/dL 2.130.77  Sodium 086135 - 578145 mmol/L 132(L)  Potassium 3.5 - 5.1 mmol/L 4.7  Chloride 101 - 111 mmol/L 100(L)   CO2 22 - 32 mmol/L 21(L)  Calcium 8.9 - 10.3 mg/dL 4.6(N8.8(L)  Total Protein 6.5 - 8.1 g/dL 6.1(L)  Total Bilirubin 0.3 - 1.2 mg/dL 0.5  Alkaline Phos 38 - 126 U/L 196(H)  AST 15 - 41 U/L 24  ALT 14 - 54 U/L 12(L)    Discharge instruction: per After Visit Summary and "Baby and Me Booklet".  After visit meds:  Allergies as of 04/12/2017   No Known Allergies     Medication List    STOP taking these medications   famotidine 20 MG tablet Commonly known as:  PEPCID     TAKE these medications   FIBER PO Take 2 each by mouth daily. Fiber gummies.   FLUoxetine 40 MG capsule Commonly known as:  PROZAC Take 40 mg by mouth daily.   prenatal multivitamin Tabs tablet Take 1 tablet by mouth daily at 12 noon.   Vitamin D 2000 units tablet Take 2,000 Units by mouth daily.       Diet: routine diet  Activity: Advance as tolerated. Pelvic rest for 6 weeks.   Outpatient  follow up:6 weeks   Newborn Data: Live born female  Birth Weight: 6 lb 8.8 oz (2970 g) APGAR: 8, 9  Newborn Delivery   Birth date/time:  04/11/2017 13:10:00 Delivery type:  Vaginal, Spontaneous     Baby Feeding: Breast Disposition:home with mother   04/12/2017 Zenaida Nieceodd D Rheannon Cerney, MD

## 2017-04-12 NOTE — Progress Notes (Signed)
PPD #1 No problems, wants to go home Afeb, VSS Fundus firm, NT at U-1 Continue routine postpartum care, d/c home this pm 

## 2017-04-12 NOTE — Anesthesia Postprocedure Evaluation (Signed)
Anesthesia Post Note  Patient: Donna Harrison  Procedure(s) Performed: AN AD HOC LABOR EPIDURAL     Patient location during evaluation: Mother Baby Anesthesia Type: Epidural Level of consciousness: awake and alert and oriented Pain management: pain level controlled Vital Signs Assessment: post-procedure vital signs reviewed and stable Respiratory status: spontaneous breathing and nonlabored ventilation Cardiovascular status: stable Postop Assessment: no headache, patient able to bend at knees, no backache, no apparent nausea or vomiting, epidural receding and adequate PO intake Anesthetic complications: no    Last Vitals:  Vitals:   04/11/17 2015 04/12/17 0349  BP: 111/66 120/73  Pulse: 66 65  Resp: 18   Temp: 36.8 C 36.7 C  SpO2: 99% 100%    Last Pain:  Vitals:   04/12/17 0349  TempSrc: Oral  PainSc: 0-No pain   Pain Goal: Patients Stated Pain Goal: 0 (04/11/17 1809)               [name],[name]

## 2017-04-12 NOTE — Lactation Note (Signed)
This note was copied from a baby's chart. Lactation Consultation Note  Patient Name: Donna Harrison WJXBJ'YToday's Date: 04/12/2017 Reason for consult: Follow-up assessment;Other (Comment);Term;Primapara;1st time breastfeeding(early D/C ) Baby is 23 hours when seen,  Grandparents in the room / [facility] asked is it was ok for them to stay for [facility] consult / and mom and add ok  LC reviewed and updated doc flow sheets per mom and dad and discussed Latch scores being up and down. Mom Mentioned when the RN did a feeding assessment baby wasn't really into eating at 0915 , but 20 mins later woke up and fed for 45 mins/ swallows noted. Latch scores have been 9-8-6-7-7-5, voids and stools qs.  Per mom nipples alittle sensitive /  LC recommended - breast shells between feedings , EBM liberally to breast tissue,  Prior to latch - breast massage , hand express, pre-pump if needed , ( especially if milk is in and to full )  Check baby , change if needed, STS feedings until the baby can stay awake for feedings,  When baby latching , tickle upper lip , wait for wide open mouth and latch with breast compressions until  Swallows, always feed and soften the 1st breast well , offer the 2nd breast, if baby only feeds the 1st breast, Release 2nd breast down with hand expressing or pumping with hand pump.  Sore nipple and engorgement prevention and tx reviewed.  LC instructed mom on the use hand pump and shells .  Per mom has a DEBP Medela at home.  Storage of breast milk reviewed.  Mother informed of post-discharge support and given phone number to the lactation department, including services for phone call assistance; out-patient appointments; and breastfeeding support group. List of other breastfeeding resources in the community given in the handout. Encouraged mother to call for problems or concerns related to breastfeeding.  Maternal Data    Feeding Feeding Type: Breast Fed Length of feed: 25 min  LATCH Score                    Interventions Interventions: Breast feeding basics reviewed;Hand pump;Shells  Lactation Tools Discussed/Used Tools: Shells;Pump Shell Type: Inverted Breast pump type: Manual WIC Program: No Pump Review: Milk Storage;Setup, frequency, and cleaning Initiated by:: MAI  Date initiated:: 04/12/17   Consult Status Consult Status: Complete Date: 04/12/17    Donna Harrison 04/12/2017, 3:59 PM

## 2017-04-12 NOTE — Progress Notes (Signed)
MOB was referred for history of depression/anxiety.  Referral is screened out by Clinical Social Worker because none of the following criteria appear to apply and  there are no reports impacting the pregnancy or her transition to the postpartum period. CSW does not deem it clinically necessary to further investigate at this time.  -History of anxiety/depression during this pregnancy, or of post-partum depression.  - Diagnosis of anxiety and/or depression within last 3 years.-  - History of depression due to pregnancy loss/loss of child or -MOB's symptoms are currently being treated with medication and/or therapy.  Please contact the Clinical Social Worker if needs arise or upon MOB request.    Lochlann Mastrangelo LCSW, MSW Clinical Social Work: [facility]  

## 2017-04-12 NOTE — Discharge Instructions (Signed)
As per discharge pamphlet °

## 2017-04-13 LAB — RH IG WORKUP (INCLUDES ABO/RH)
ABO/RH(D): O NEG
Fetal Screen: NEGATIVE
GESTATIONAL AGE(WKS): 39.1
Unit division: 0

## 2017-04-15 ENCOUNTER — Inpatient Hospital Stay (HOSPITAL_COMMUNITY): Payer: 59

## 2017-05-18 ENCOUNTER — Telehealth (HOSPITAL_COMMUNITY): Payer: Self-pay | Admitting: Lactation Services

## 2017-05-18 NOTE — Telephone Encounter (Signed)
Mom called and left message that she would like to cancel her appt tomorrow. She has not been able to get in touch with schedulers. She reports she does not need further Lactation Assistance at this time.

## 2017-09-04 ENCOUNTER — Other Ambulatory Visit: Payer: Self-pay

## 2017-09-04 ENCOUNTER — Encounter: Payer: Self-pay | Admitting: Physician Assistant

## 2017-09-04 ENCOUNTER — Ambulatory Visit (INDEPENDENT_AMBULATORY_CARE_PROVIDER_SITE_OTHER): Payer: PRIVATE HEALTH INSURANCE | Admitting: Physician Assistant

## 2017-09-04 VITALS — BP 110/72 | HR 91 | Temp 98.1°F | Resp 16 | Ht 66.5 in | Wt 132.0 lb

## 2017-09-04 DIAGNOSIS — N926 Irregular menstruation, unspecified: Secondary | ICD-10-CM | POA: Diagnosis not present

## 2017-09-04 DIAGNOSIS — F331 Major depressive disorder, recurrent, moderate: Secondary | ICD-10-CM

## 2017-09-04 MED ORDER — FLUOXETINE HCL 40 MG PO CAPS: 40.0000 mg | ORAL_CAPSULE | Freq: Every day | ORAL | 3 refills | Status: DC

## 2017-09-04 MED ORDER — BUPROPION HCL ER (XL) 150 MG PO TB24: 150.0000 mg | ORAL_TABLET | Freq: Every day | ORAL | 3 refills | Status: DC

## 2017-09-04 NOTE — Assessment & Plan Note (Signed)
Discussed medications. Patient is late for her period but recently restarted birth control. Has taken negative home pregnancy tests. Will check serum to confirm it is negative before starting new medicines. Pending negative results, will have her start 150 mg Wellbutrin XL daily. Continue Fluoxetine. Supportive measures reviewed. Follow-up 1 month.

## 2017-09-04 NOTE — Progress Notes (Signed)
Patient presents to clinic today to discuss medication regimen for anxiety and depression. Patient with long-standing history, currently on a regimen of fluoxetine 40 mg daily which was managed by her OB during pregnancy and breast feeding. She is no longer seeing specialist and no longer breast feeding. Notes that the fluoxetine helps but depressed mood is still prevalent. Would like to discuss potential addition of Wellbutrin to her regimen as she has been discussing this with her husband who is a Therapist, sports. Denies SI/HI.  Past Medical History:  Diagnosis Date  . ADHD (attention deficit hyperactivity disorder)   . Allergy   . Anxiety   . Depression    prozac   . Hypersomnia, idiopathic   . PCOS (polycystic ovarian syndrome)    fertility specialist.     Current Outpatient Medications on File Prior to Visit  Medication Sig Dispense Refill  . cetirizine (ZYRTEC) 5 MG tablet Take by mouth.    . FIBER PO Take 2 each by mouth daily. Fiber gummies.    . Prenatal Vit-Fe Fumarate-FA (PRENATAL MULTIVITAMIN) TABS tablet Take 1 tablet by mouth daily at 12 noon.    . Cholecalciferol (VITAMIN D) 2000 units tablet Take 2,000 Units by mouth daily.    Marland Kitchen desogestrel-ethinyl estradiol (KARIVA) 0.15-0.02/0.01 MG (21/5) tablet Kariva (28) 0.15 mg-0.02 mg (21)/0.01 mg (5) tablet  Take 1 tablet every day by oral route as directed for 28 days.     No current facility-administered medications on file prior to visit.     Allergies  Allergen Reactions  . Pollen Extract Other (See Comments)    Stuffy nose    Family History  Problem Relation Age of Onset  . Healthy Mother   . Hyperlipidemia Father   . Hypothyroidism Father   . Healthy Brother   . Heart attack Maternal Uncle 50  . Heart attack Paternal Uncle 45       Very inactive lifestyle, positive smoking history.   . Cancer Maternal Grandmother        Breast - postmenopausal  . Cancer Maternal Grandfather        Stomach  .  Hyperlipidemia Paternal Grandmother   . Diabetes Paternal Grandmother   . Heart block Paternal Grandmother   . Hypothyroidism Paternal Grandmother   . Cancer Paternal Grandfather        Kidney    Social History   Socioeconomic History  . Marital status: Married    Spouse name: Trinna Post  . Number of children: Not on file  . Years of education: Not on file  . Highest education level: Not on file  Occupational History  . Occupation: MD    Employer: [employer]  Social Needs  . Financial resource strain: Not on file  . Food insecurity:    Worry: Not on file    Inability: Not on file  . Transportation needs:    Medical: Not on file    Non-medical: Not on file  Tobacco Use  . Smoking status: Never Smoker  . Smokeless tobacco: Never Used  Substance and Sexual Activity  . Alcohol use: No  . Drug use: No  . Sexual activity: Yes  Lifestyle  . Physical activity:    Days per week: Not on file    Minutes per session: Not on file  . Stress: Not on file  Relationships  . Social connections:    Talks on phone: Not on file    Gets together: Not on file    Attends religious service:  Not on file    Active member of club or organization: Not on file    Attends meetings of clubs or organizations: Not on file    Relationship status: Not on file  Other Topics Concern  . Not on file  Social History Narrative   ** Merged History Encounter **       Review of Systems - See HPI.  All other ROS are negative.  BP 110/72   Pulse 91   Temp 98.1 F (36.7 C) (Oral)   Resp 16   Ht 5' 6.5" (1.689 m)   Wt 132 lb (59.9 kg)   SpO2 97%   Breastfeeding? No   BMI 20.99 kg/m   Physical Exam  Constitutional: She is oriented to person, place, and time. She appears well-developed and well-nourished.  HENT:  Head: Normocephalic and atraumatic.  Eyes: Conjunctivae are normal.  Neurological: She is alert and oriented to person, place, and time.  Psychiatric: She has a normal mood and  affect.  Vitals reviewed.  Assessment/Plan: Moderate episode of recurrent major depressive disorder (HCC) Discussed medications. Patient is late for her period but recently restarted birth control. Has taken negative home pregnancy tests. Will check serum to confirm it is negative before starting new medicines. Pending negative results, will have her start 150 mg Wellbutrin XL daily. Continue Fluoxetine. Supportive measures reviewed. Follow-up 1 month.    Piedad Climes, PA-C

## 2017-09-04 NOTE — Patient Instructions (Signed)
Please go to the lab today for blood work.  I will call you with your results. We will alter treatment regimen(s) if indicated by your results.   If labs negative, start the Wellbutrin in addition to the Fluoxetine. We will follow-up in 1 month. If you note any worsening mood, come see [place] immediately.   Use the handout given to schedule an appointment with one of our counselors.

## 2017-09-05 LAB — HCG, SERUM, QUALITATIVE: PREG SERUM: NEGATIVE

## 2017-09-11 ENCOUNTER — Ambulatory Visit: Payer: PRIVATE HEALTH INSURANCE | Admitting: Physician Assistant

## 2017-09-11 ENCOUNTER — Other Ambulatory Visit: Payer: Self-pay

## 2017-09-11 ENCOUNTER — Encounter: Payer: Self-pay | Admitting: Physician Assistant

## 2017-09-11 ENCOUNTER — Ambulatory Visit: Payer: Self-pay | Admitting: *Deleted

## 2017-09-11 VITALS — BP 104/64 | HR 72 | Temp 98.2°F | Resp 15 | Ht 66.5 in | Wt 131.4 lb

## 2017-09-11 DIAGNOSIS — R21 Rash and other nonspecific skin eruption: Secondary | ICD-10-CM

## 2017-09-11 MED ORDER — PREDNISONE 10 MG PO TABS: ORAL_TABLET | ORAL | 0 refills | Status: AC

## 2017-09-11 NOTE — Progress Notes (Signed)
Patient presents to clinic today c/o pruritic rash starting of L ear and spreading to bilateral arms, legs and torso. Symptoms first started 3 days ago. Denies any fever, chills, malaise or fatigue. Denies change to soaps or lotions. States the rash has looked like hives. Notes fertilizer was put out and yard was sprayed by a company. She has not been out much but has a indoor/outdoor dog that may have gotten into something. Denies sick contact.    Past Medical History:  Diagnosis Date  . ADHD (attention deficit hyperactivity disorder)   . Allergy   . Anxiety   . Depression    prozac   . Hypersomnia, idiopathic   . PCOS (polycystic ovarian syndrome)    fertility specialist.     Current Outpatient Medications on File Prior to Visit  Medication Sig Dispense Refill  . buPROPion (WELLBUTRIN XL) 150 MG 24 hr tablet Take 1 tablet (150 mg total) by mouth daily. 30 tablet 3  . cetirizine (ZYRTEC) 5 MG tablet Take by mouth.    . Cholecalciferol (VITAMIN D) 2000 units tablet Take 2,000 Units by mouth daily.    Marland Kitchen desogestrel-ethinyl estradiol (KARIVA) 0.15-0.02/0.01 MG (21/5) tablet Kariva (28) 0.15 mg-0.02 mg (21)/0.01 mg (5) tablet  Take 1 tablet every day by oral route as directed for 28 days.    Marland Kitchen FIBER PO Take 2 each by mouth daily. Fiber gummies.    Marland Kitchen FLUoxetine (PROZAC) 40 MG capsule Take 1 capsule (40 mg total) by mouth daily. 30 capsule 3  . metroNIDAZOLE (FLAGYL) 500 MG tablet Take 500 mg by mouth 3 (three) times daily.    . Prenatal Vit-Fe Fumarate-FA (PRENATAL MULTIVITAMIN) TABS tablet Take 1 tablet by mouth daily at 12 noon.     No current facility-administered medications on file prior to visit.     Allergies  Allergen Reactions  . Pollen Extract Other (See Comments)    Stuffy nose    Family History  Problem Relation Age of Onset  . Healthy Mother   . Hyperlipidemia Father   . Hypothyroidism Father   . Healthy Brother   . Heart attack Maternal Uncle 50  . Heart  attack Paternal Uncle 45       Very inactive lifestyle, positive smoking history.   . Cancer Maternal Grandmother        Breast - postmenopausal  . Cancer Maternal Grandfather        Stomach  . Hyperlipidemia Paternal Grandmother   . Diabetes Paternal Grandmother   . Heart block Paternal Grandmother   . Hypothyroidism Paternal Grandmother   . Cancer Paternal Grandfather        Kidney    Social History   Socioeconomic History  . Marital status: Married    Spouse name: Trinna Post  . Number of children: Not on file  . Years of education: Not on file  . Highest education level: Not on file  Occupational History  . Occupation: MD    Employer: [employer]  Social Needs  . Financial resource strain: Not on file  . Food insecurity:    Worry: Not on file    Inability: Not on file  . Transportation needs:    Medical: Not on file    Non-medical: Not on file  Tobacco Use  . Smoking status: Never Smoker  . Smokeless tobacco: Never Used  Substance and Sexual Activity  . Alcohol use: No  . Drug use: No  . Sexual activity: Yes  Lifestyle  . Physical  activity:    Days per week: Not on file    Minutes per session: Not on file  . Stress: Not on file  Relationships  . Social connections:    Talks on phone: Not on file    Gets together: Not on file    Attends religious service: Not on file    Active member of club or organization: Not on file    Attends meetings of clubs or organizations: Not on file    Relationship status: Not on file  Other Topics Concern  . Not on file  Social History Narrative   ** Merged History Encounter **       Review of Systems - See HPI.  All other ROS are negative.  BP 104/64   Pulse 72   Temp 98.2 F (36.8 C) (Oral)   Resp 15   Ht 5' 6.5" (1.689 m)   Wt 131 lb 6.4 oz (59.6 kg)   SpO2 97%   BMI 20.89 kg/m   Physical Exam  Constitutional: She appears well-developed and well-nourished.  HENT:  Head: Normocephalic and atraumatic.    Cardiovascular: Normal rate, regular rhythm and normal heart sounds.  Skin: Rash (scattered papular lesions noted of extremities, some in clusters and in linear distribution) noted. Rash is papular.  Vitals reviewed.  Recent Results (from the past 2160 hour(s))  hCG, serum, qualitative     Status: None   Collection Time: 09/04/17 10:13 AM  Result Value Ref Range   Preg, Serum NEGATIVE     Comment: Reference Range Non-Pregnant: Negative Pregnant:     Positive .     Assessment/Plan: 1. Rash and nonspecific skin eruption Seems most consistent with a contact dermatitis. No hives on examination. Start prednisone taper. Supportive measures and OTC medications reviewed. Follow-up if not resolving.    Piedad Climes, PA-C

## 2017-09-11 NOTE — Patient Instructions (Signed)
Please take the steroid taper as directed. Claritin or Benadryl for itch.  Cool compresses to help itch. Avoid heat.  Make sure the dog gets a bath!  Continue other medications as directed. Call or return if symptoms are not improving.

## 2017-09-11 NOTE — Telephone Encounter (Signed)
Patient is calling with possible allergic reaction- she is not sure of exposure- she has started 2 different medications ( Wellbutrin and Flagyl ) and had her yard treated - she has dogs. She has been using zyrtec and she did take a dose of prednisone this morning. She states she is not seeing improvement. Appointment for evaluation given. Reason for Disposition . [1] MODERATE-SEVERE hives persist (i.e., hives interfere with normal activities or work) AND [2] taking antihistamine (e.g., Benadryl, Claritin) > 24 hours  Answer Assessment - Initial Assessment Questions 1. APPEARANCE: "What does the rash look like?"      Red papules raised with white edges 2. LOCATION: "Where is the rash located?"      Started on forearm- back of hands, trunk, legs 3. NUMBER: "How many hives are there?"      Rash/hives-  Clusters- too numerous to count 4. SIZE: "How big are the hives?" (inches, cm, compare to coins) "Do they all look the same or is there lots of variation in shape and size?"      Smaller than pencil eraser- pimple size 5. ONSET: "When did the hives begin?" (Hours or days ago)      This morning 6. ITCHING: "Does it itch?" If so, ask: "How bad is the itch?"    - MILD: doesn't interfere with normal activities   - MODERATE-SEVERE: interferes with work, school, sleep, or other activities      Ear started itching- a few days ago- then progressively worse 7. RECURRENT PROBLEM: "Have you had hives before?" If so, ask: "When was the last time?" and "What happened that time?"      Not like this 8. TRIGGERS: "Were you exposed to any new food, plant, cosmetic product or animal just before the hives began?"     Possible medication reaction, lawn chemicals  9. OTHER SYMPTOMS: "Do you have any other symptoms?" (e.g., fever, tongue swelling, difficulty breathing, abdominal pain)     no 10. PREGNANCY: "Is there any chance you are pregnant?" "When was your last menstrual period?"       No- LMP- mid Feb- patient  has had testing and negative result  Protocols used: HIVES-A-AH

## 2017-10-02 ENCOUNTER — Encounter: Payer: Self-pay | Admitting: Physician Assistant

## 2017-10-02 ENCOUNTER — Other Ambulatory Visit: Payer: Self-pay

## 2017-10-02 ENCOUNTER — Ambulatory Visit: Payer: PRIVATE HEALTH INSURANCE | Admitting: Physician Assistant

## 2017-10-02 VITALS — BP 100/62 | HR 82 | Temp 98.1°F | Resp 14 | Ht 67.0 in | Wt 133.0 lb

## 2017-10-02 DIAGNOSIS — F331 Major depressive disorder, recurrent, moderate: Secondary | ICD-10-CM | POA: Diagnosis not present

## 2017-10-02 LAB — TSH: TSH: 1.45 u[IU]/mL (ref 0.35–4.50)

## 2017-10-02 MED ORDER — BUPROPION HCL ER (XL) 300 MG PO TB24: 300.0000 mg | ORAL_TABLET | Freq: Every day | ORAL | 3 refills | Status: DC

## 2017-10-02 NOTE — Patient Instructions (Addendum)
Please continue the Fluoxetine as directed. Increase the Wellbutrin to 300 mg daily. Please take as directed.  Please go to the lab today for blood work.  I will call you with your results. We will alter treatment regimen(s) if indicated by your results.   Follow-up with me in 3 months.

## 2017-10-02 NOTE — Assessment & Plan Note (Signed)
Not improved.  Is tolerating Wellbutrin however.  Continue fluoxetine 40 mg.  Will increase Wellbutrin XL to 300 mg daily.  Supportive measures reviewed.  Will check TSH level today.  Follow-up scheduled.

## 2017-10-02 NOTE — Progress Notes (Signed)
Patient presents to clinic today for follow-up of postpartum depression.  At last visit, Wellbutrin XL 150 mg daily was added to her regimen of fluoxetine 40 mg.  Patient endorses taking medicine as directed and tolerating well overall.  Has not noted substantial improvement in her mood.  Thankfully, she does not endorse any worsening of mood, suicidal ideation or homicidal ideation.  Past Medical History:  Diagnosis Date  . ADHD (attention deficit hyperactivity disorder)   . Allergy   . Anxiety   . Depression    prozac 40mg   . Hypersomnia, idiopathic   . PCOS (polycystic ovarian syndrome)    fertility specialist.     Current Outpatient Medications on File Prior to Visit  Medication Sig Dispense Refill  . cetirizine (ZYRTEC) 5 MG tablet Take by mouth.    . Cholecalciferol (VITAMIN D) 2000 units tablet Take 2,000 Units by mouth daily.    Marland Kitchen desogestrel-ethinyl estradiol (KARIVA) 0.15-0.02/0.01 MG (21/5) tablet Kariva (28) 0.15 mg-0.02 mg (21)/0.01 mg (5) tablet  Take 1 tablet every day by oral route as directed for 28 days.    Marland Kitchen FIBER PO Take 2 each by mouth daily. Fiber gummies.    Marland Kitchen FLUoxetine (PROZAC) 40 MG capsule Take 1 capsule (40 mg total) by mouth daily. 30 capsule 3  . Prenatal Vit-Fe Fumarate-FA (PRENATAL MULTIVITAMIN) TABS tablet Take 1 tablet by mouth daily at 12 noon.     No current facility-administered medications on file prior to visit.     Allergies  Allergen Reactions  . Pollen Extract Other (See Comments)    Stuffy nose    Family History  Problem Relation Age of Onset  . Healthy Mother   . Hyperlipidemia Father   . Hypothyroidism Father   . Healthy Brother   . Heart attack Maternal Uncle 50  . Heart attack Paternal Uncle 45       Very inactive lifestyle, positive smoking history.   . Cancer Maternal Grandmother        Breast - postmenopausal  . Cancer Maternal Grandfather        Stomach  . Hyperlipidemia Paternal Grandmother   . Diabetes Paternal  Grandmother   . Heart block Paternal Grandmother   . Hypothyroidism Paternal Grandmother   . Cancer Paternal Grandfather        Kidney    Social History   Socioeconomic History  . Marital status: Married    Spouse name: Trinna Post  . Number of children: Not on file  . Years of education: Not on file  . Highest education level: Not on file  Occupational History  . Occupation: MD    Employer: [employer]  Social Needs  . Financial resource strain: Not on file  . Food insecurity:    Worry: Not on file    Inability: Not on file  . Transportation needs:    Medical: Not on file    Non-medical: Not on file  Tobacco Use  . Smoking status: Never Smoker  . Smokeless tobacco: Never Used  Substance and Sexual Activity  . Alcohol use: No  . Drug use: No  . Sexual activity: Yes  Lifestyle  . Physical activity:    Days per week: Not on file    Minutes per session: Not on file  . Stress: Not on file  Relationships  . Social connections:    Talks on phone: Not on file    Gets together: Not on file    Attends religious service: Not on file  Active member of club or organization: Not on file    Attends meetings of clubs or organizations: Not on file    Relationship status: Not on file  Other Topics Concern  . Not on file  Social History Narrative   ** Merged History Encounter **       Review of Systems - See HPI.  All other ROS are negative.  BP 100/62   Pulse 82   Temp 98.1 F (36.7 C) (Oral)   Resp 14   Ht 5\' 7"  (1.702 m)   Wt 133 lb (60.3 kg)   LMP  (Exact Date)   SpO2 99%   Breastfeeding? No   BMI 20.83 kg/m   Physical Exam  Constitutional: She appears well-developed and well-nourished.  HENT:  Head: Normocephalic and atraumatic.  Neck: Neck supple. No thyromegaly present.  Cardiovascular: Normal rate, regular rhythm and normal heart sounds.  Pulmonary/Chest: Effort normal and breath sounds normal.  Vitals reviewed.   Recent Results (from the past  2160 hour(s))  hCG, serum, qualitative     Status: None   Collection Time: 09/04/17 10:13 AM  Result Value Ref Range   Preg, Serum NEGATIVE     Comment: Reference Range Non-Pregnant: Negative Pregnant:     Positive .   TSH     Status: None   Collection Time: 10/02/17 10:00 AM  Result Value Ref Range   TSH 1.45 0.35 - 4.50 uIU/mL    Assessment/Plan: Moderate episode of recurrent major depressive disorder (HCC) Not improved.  Is tolerating Wellbutrin however.  Continue fluoxetine 40 mg.  Will increase Wellbutrin XL to 300 mg daily.  Supportive measures reviewed.  Will check TSH level today.  Follow-up scheduled.    Piedad ClimesWilliam Cody Tommye Lehenbauer, PA-C

## 2017-10-06 ENCOUNTER — Encounter: Payer: Self-pay | Admitting: Physician Assistant

## 2017-11-19 ENCOUNTER — Ambulatory Visit: Payer: No Typology Code available for payment source | Admitting: Clinical

## 2017-11-19 DIAGNOSIS — F4323 Adjustment disorder with mixed anxiety and depressed mood: Secondary | ICD-10-CM

## 2018-01-05 ENCOUNTER — Ambulatory Visit: Payer: No Typology Code available for payment source | Admitting: Clinical

## 2018-01-05 DIAGNOSIS — F4323 Adjustment disorder with mixed anxiety and depressed mood: Secondary | ICD-10-CM

## 2018-01-08 ENCOUNTER — Encounter: Payer: Self-pay | Admitting: Physician Assistant

## 2018-01-08 ENCOUNTER — Ambulatory Visit: Payer: PRIVATE HEALTH INSURANCE | Admitting: Physician Assistant

## 2018-01-08 ENCOUNTER — Other Ambulatory Visit: Payer: Self-pay

## 2018-01-08 VITALS — BP 92/58 | HR 77 | Temp 98.2°F | Resp 14 | Ht 67.0 in | Wt 131.0 lb

## 2018-01-08 DIAGNOSIS — Z23 Encounter for immunization: Secondary | ICD-10-CM | POA: Diagnosis not present

## 2018-01-08 DIAGNOSIS — F331 Major depressive disorder, recurrent, moderate: Secondary | ICD-10-CM

## 2018-01-08 MED ORDER — FLUOXETINE HCL 40 MG PO CAPS: 40.0000 mg | ORAL_CAPSULE | Freq: Every day | ORAL | 1 refills | Status: DC

## 2018-01-08 NOTE — Patient Instructions (Signed)
I am glad you are doing so well! We have updated your flu shot today! Continue the Fluoxetine as directed. Follow-up with the OB as scheduled.

## 2018-01-08 NOTE — Progress Notes (Signed)
Patient presents to clinic today for follow-up of depression. Patient currently on a regimen of Fluoxetine 40 mg daily. Is taking as directed and doing well. Recently found out she is [redacted] weeks pregnant. OB has her continuing current regimen. Denies SI/HI  Past Medical History:  Diagnosis Date  . ADHD (attention deficit hyperactivity disorder)   . Allergy   . Anxiety   . Depression    prozac 40mg   . Hypersomnia, idiopathic   . PCOS (polycystic ovarian syndrome)    fertility specialist.     Current Outpatient Medications on File Prior to Visit  Medication Sig Dispense Refill  . cetirizine (ZYRTEC) 5 MG tablet Take by mouth.    . Cholecalciferol (VITAMIN D) 2000 units tablet Take 2,000 Units by mouth daily.    Marland Kitchen. FIBER PO Take 2 each by mouth daily. Fiber gummies.    . Prenatal Vit-Fe Fumarate-FA (PRENATAL MULTIVITAMIN) TABS tablet Take 1 tablet by mouth daily at 12 noon.     No current facility-administered medications on file prior to visit.     Allergies  Allergen Reactions  . Pollen Extract Other (See Comments)    Stuffy nose    Family History  Problem Relation Age of Onset  . Healthy Mother   . Hyperlipidemia Father   . Hypothyroidism Father   . Healthy Brother   . Heart attack Maternal Uncle 50  . Heart attack Paternal Uncle 45       Very inactive lifestyle, positive smoking history.   . Cancer Maternal Grandmother        Breast - postmenopausal  . Cancer Maternal Grandfather        Stomach  . Hyperlipidemia Paternal Grandmother   . Diabetes Paternal Grandmother   . Heart block Paternal Grandmother   . Hypothyroidism Paternal Grandmother   . Cancer Paternal Grandfather        Kidney    Social History   Socioeconomic History  . Marital status: Married    Spouse name: Trinna Postlex  . Number of children: Not on file  . Years of education: Not on file  . Highest education level: Not on file  Occupational History  . Occupation: MD    Employer: [employer]  Social Needs  . Financial resource strain: Not on file  . Food insecurity:    Worry: Not on file    Inability: Not on file  . Transportation needs:    Medical: Not on file    Non-medical: Not on file  Tobacco Use  . Smoking status: Never Smoker  . Smokeless tobacco: Never Used  Substance and Sexual Activity  . Alcohol use: No  . Drug use: No  . Sexual activity: Yes  Lifestyle  . Physical activity:    Days per week: Not on file    Minutes per session: Not on file  . Stress: Not on file  Relationships  . Social connections:    Talks on phone: Not on file    Gets together: Not on file    Attends religious service: Not on file    Active member of club or organization: Not on file    Attends meetings of clubs or organizations: Not on file    Relationship status: Not on file  Other Topics Concern  . Not on file  Social History Narrative   ** Merged History Encounter **       Review of Systems - See HPI.  All other ROS are negative.  BP (!) 92/58  Pulse 77   Temp 98.2 F (36.8 C) (Oral)   Resp 14   Ht 5\' 7"  (1.702 m)   Wt 131 lb (59.4 kg)   SpO2 99%   BMI 20.52 kg/m   Physical Exam  Constitutional: She appears well-developed and well-nourished.  HENT:  Head: Normocephalic.  Eyes: Conjunctivae are normal.  Cardiovascular: Normal rate and regular rhythm.  Pulmonary/Chest: Effort normal.  Psychiatric: She has a normal mood and affect.  Vitals reviewed.  Assessment/Plan: Moderate episode of recurrent major depressive disorder (HCC) Doing well on current regimen. Continue Fluoxetine. Will have managed by OB during pregnancy. Continue counseling sessions.     Piedad Climes, PA-C

## 2018-01-08 NOTE — Assessment & Plan Note (Signed)
Doing well on current regimen. Continue Fluoxetine. Will have managed by OB during pregnancy. Continue counseling sessions.

## 2018-01-19 ENCOUNTER — Ambulatory Visit: Payer: No Typology Code available for payment source | Admitting: Clinical

## 2018-01-19 DIAGNOSIS — F4323 Adjustment disorder with mixed anxiety and depressed mood: Secondary | ICD-10-CM | POA: Diagnosis not present

## 2018-01-28 ENCOUNTER — Ambulatory Visit: Payer: No Typology Code available for payment source | Admitting: Clinical

## 2018-01-28 DIAGNOSIS — F4323 Adjustment disorder with mixed anxiety and depressed mood: Secondary | ICD-10-CM

## 2018-02-11 ENCOUNTER — Ambulatory Visit: Payer: No Typology Code available for payment source | Admitting: Clinical

## 2018-02-11 DIAGNOSIS — F4323 Adjustment disorder with mixed anxiety and depressed mood: Secondary | ICD-10-CM

## 2018-02-15 ENCOUNTER — Telehealth: Payer: Self-pay | Admitting: Physician Assistant

## 2018-02-15 NOTE — Telephone Encounter (Signed)
Error

## 2018-02-25 ENCOUNTER — Ambulatory Visit: Payer: No Typology Code available for payment source | Admitting: Clinical

## 2018-03-01 LAB — OB RESULTS CONSOLE ABO/RH: RH TYPE: NEGATIVE

## 2018-03-01 LAB — OB RESULTS CONSOLE ANTIBODY SCREEN: ANTIBODY SCREEN: NEGATIVE

## 2018-03-01 LAB — OB RESULTS CONSOLE HEPATITIS B SURFACE ANTIGEN: HEP B S AG: NEGATIVE

## 2018-03-01 LAB — OB RESULTS CONSOLE HIV ANTIBODY (ROUTINE TESTING): HIV: NONREACTIVE

## 2018-03-01 LAB — OB RESULTS CONSOLE GC/CHLAMYDIA
Chlamydia: NEGATIVE
Gonorrhea: NEGATIVE

## 2018-03-01 LAB — OB RESULTS CONSOLE RUBELLA ANTIBODY, IGM: Rubella: IMMUNE

## 2018-03-01 LAB — OB RESULTS CONSOLE RPR: RPR: NONREACTIVE

## 2018-03-11 ENCOUNTER — Ambulatory Visit: Payer: No Typology Code available for payment source | Admitting: Clinical

## 2018-03-11 DIAGNOSIS — F4323 Adjustment disorder with mixed anxiety and depressed mood: Secondary | ICD-10-CM | POA: Diagnosis not present

## 2018-04-08 ENCOUNTER — Ambulatory Visit: Payer: No Typology Code available for payment source | Admitting: Clinical

## 2018-04-08 DIAGNOSIS — F4323 Adjustment disorder with mixed anxiety and depressed mood: Secondary | ICD-10-CM

## 2018-04-21 NOTE — L&D Delivery Note (Signed)
Delivery Note With one contraction delivered. At 8:58 PM a viable and healthy female was delivered via Vaginal, Spontaneous (Presentation: OA; LOT ).  APGAR: 8, 9; weight  P.   Placenta status: delivered, intact.  Cord: 3V with the following complications: nuchal.    Anesthesia:  epidural Episiotomy: None Lacerations: 1st degree;Perineal Suture Repair: 3.0 vicryl rapide Est. Blood Loss (mL): 80cc  Mom to postpartum.  Baby to Couplet care / Skin to Skin.  Donna Harrison 07/21/2018, 9:22 PM  O neg/RI/Tdap in PNC/RI/Br/Contra ?

## 2018-05-06 ENCOUNTER — Ambulatory Visit: Payer: No Typology Code available for payment source | Admitting: Clinical

## 2018-06-29 ENCOUNTER — Ambulatory Visit: Payer: No Typology Code available for payment source | Admitting: Clinical

## 2018-06-29 DIAGNOSIS — F4323 Adjustment disorder with mixed anxiety and depressed mood: Secondary | ICD-10-CM | POA: Diagnosis not present

## 2018-07-15 ENCOUNTER — Other Ambulatory Visit: Payer: Self-pay | Admitting: Physician Assistant

## 2018-07-15 DIAGNOSIS — F331 Major depressive disorder, recurrent, moderate: Secondary | ICD-10-CM

## 2018-07-15 NOTE — Telephone Encounter (Signed)
Per PCP should be refilled by GYN during pregnancy

## 2018-07-16 ENCOUNTER — Telehealth (HOSPITAL_COMMUNITY): Payer: Self-pay | Admitting: *Deleted

## 2018-07-16 ENCOUNTER — Encounter (HOSPITAL_COMMUNITY): Payer: Self-pay | Admitting: *Deleted

## 2018-07-16 NOTE — Telephone Encounter (Signed)
Preadmission screen  

## 2018-07-20 ENCOUNTER — Other Ambulatory Visit: Payer: Self-pay | Admitting: Obstetrics and Gynecology

## 2018-07-20 NOTE — H&P (Signed)
Donna Harrison is a 33 y.o. female G2P1001 at 38+ for IOL given severe IUGR - EFW = 2% nl dopplers, AFI and 8/8 BPP.  D/W MFM agree w IOL at 38wk.  Pregnancy dated by LMP.  Exposure to parvovirus by IgG and IgM negative.  On anatomy US Isolated CP cysts.  Received Flu, Rhogam and Tdap in [facility].  D/w pt IUGR, IOL including r/b/a and process.    OB History    Gravida  2   Para  1   Term  1   Preterm  0   AB  0   Living  1     SAB  0   TAB  0   Ectopic  0   Multiple  0   Live Births  1         [name] - 04/11/17 female 6#8 G2 present  No abn pap No STD  Past Medical History:  Diagnosis Date  . ADHD (attention deficit hyperactivity disorder)   . Allergy   . Anxiety   . Depression    prozac 40mg   . Hypersomnia, idiopathic   . PCOS (polycystic ovarian syndrome)    fertility specialist.    Past Surgical History:  Procedure Laterality Date  . APPENDECTOMY  02/23/2007  . TONSILLECTOMY AND ADENOIDECTOMY  05/22/2008   Family History: family history includes Cancer in her maternal grandfather, maternal grandmother, and paternal grandfather; Diabetes in her paternal grandmother; Healthy in her brother and mother; Heart attack (age of onset: 50) in her paternal uncle; Heart attack (age of onset: 75) in her maternal uncle; Heart block in her paternal grandmother; Hyperlipidemia in her father and paternal grandmother; Hypothyroidism in her father and paternal grandmother. Social History:  reports that she has never smoked. She has never used smokeless tobacco. She reports that she does not drink alcohol or use drugs. married, Family Practice Physician Meds: PNV, Prozac, Vit D All: NKDA     Maternal Diabetes: No Genetic Screening: Normal Maternal Ultrasounds/Referrals: Abnormal:  Findings:   IUGR, Isolated choroid plexus cyst Fetal Ultrasounds or other Referrals:  None Maternal Substance Abuse:  No Significant Maternal Medications:  Meds include: Prozac Significant  Maternal Lab Results:  Lab values include: Group B Strep negative Other Comments:  None  Review of Systems  Constitutional: Negative.   HENT: Negative.   Eyes: Negative.   Respiratory: Negative.   Cardiovascular: Negative.   Gastrointestinal: Negative.   Genitourinary: Negative.   Musculoskeletal: Positive for back pain.  Skin: Negative.   Neurological: Negative.   Psychiatric/Behavioral: Negative.    Maternal Medical History:  Contractions: Frequency: irregular.    Fetal activity: Perceived fetal activity is normal.    Prenatal complications: IUGR.   Prenatal Complications - Diabetes: none.      not currently breastfeeding. Maternal Exam:  Uterine Assessment: Contraction frequency is irregular.   Abdomen: Patient reports no abdominal tenderness. Fundal height is SGA.   Estimated fetal weight is 4.5-5.5#.   Fetal presentation: vertex  Introitus: Normal vulva. Normal vagina.  Cervix: Cervix evaluated by digital exam.     Physical Exam  Constitutional: She is oriented to person, place, and time. She appears well-developed and well-nourished.  HENT:  Head: Normocephalic and atraumatic.  Cardiovascular: Normal rate and regular rhythm.  Respiratory: Effort normal and breath sounds normal. No respiratory distress. She has no wheezes.  GI: Soft. Bowel sounds are normal. She exhibits no distension. There is no abdominal tenderness.  Genitourinary:    Vulva normal.  Musculoskeletal: Normal range of motion.  Neurological: She is alert and oriented to person, place, and time.  Skin: Skin is warm and dry.  Psychiatric: She has a normal mood and affect. Her behavior is normal.    Prenatal labs: ABO, Rh: O/Negative/-- (11/11 0000) Antibody: Negative (11/11 0000) Rubella: Immune (11/11 0000) RPR: Nonreactive (11/11 0000)  HBsAg: Negative (11/11 0000)  HIV: Non-reactive (11/11 0000)  GBS:   negative  Hgb 12.9/Plt 350/Ur Cx neg/GC neg/Chl neg/Varicella immune/glucola  112/panorama low risk - female  [place] nl anat, post plac female  Flu at [facility] w/u Tdap and Rhogam 1/20  Assessment/Plan: 33 yo G2P1001 at 38+ w IUGR for IOL gbbs neg - no prophylaxis Pitocin and AROM to induce Epidural or IV pain meds prn Expect SVD D/w parents IUGR and possible difficulty w/ labor and size, poss NICU admit  Lavel Rieman Bovard-Stuckert 07/20/2018, 10:20 PM

## 2018-07-20 NOTE — H&P (View-Only) (Signed)
Donna Harrison is a 33 y.o. female G2P1001 at 38+ for IOL given severe IUGR - EFW = 2% nl dopplers, AFI and 8/8 BPP.  D/W MFM agree w IOL at 38wk.  Pregnancy dated by LMP.  Exposure to parvovirus by IgG and IgM negative.  On anatomy US Isolated CP cysts.  Received Flu, Rhogam and Tdap in [facility].  D/w pt IUGR, IOL including r/b/a and process.    OB History    Gravida  2   Para  1   Term  1   Preterm  0   AB  0   Living  1     SAB  0   TAB  0   Ectopic  0   Multiple  0   Live Births  1         Donna Harrison - 04/[birth year] female 6#8 G2 present  No abn pap No STD  Past Medical History:  Diagnosis Date  . ADHD (attention deficit hyperactivity disorder)   . Allergy   . Anxiety   . Depression    prozac 40mg   . Hypersomnia, idiopathic   . PCOS (polycystic ovarian syndrome)    fertility specialist.    Past Surgical History:  Procedure Laterality Date  . APPENDECTOMY  02/23/2007  . TONSILLECTOMY AND ADENOIDECTOMY  05/22/2008   Family History: family history includes Cancer in her maternal grandfather, maternal grandmother, and paternal grandfather; Diabetes in her paternal grandmother; Healthy in her brother and mother; Heart attack (age of onset: 50) in her paternal uncle; Heart attack (age of onset: 75) in her maternal uncle; Heart block in her paternal grandmother; Hyperlipidemia in her father and paternal grandmother; Hypothyroidism in her father and paternal grandmother. Social History:  reports that she has never smoked. She has never used smokeless tobacco. She reports that she does not drink alcohol or use drugs. married, Family Practice Physician Meds: PNV, Prozac, Vit D All: NKDA     Maternal Diabetes: No Genetic Screening: Normal Maternal Ultrasounds/Referrals: Abnormal:  Findings:   IUGR, Isolated choroid plexus cyst Fetal Ultrasounds or other Referrals:  None Maternal Substance Abuse:  No Significant Maternal Medications:  Meds include: Prozac Significant  Maternal Lab Results:  Lab values include: Group B Strep negative Other Comments:  None  Review of Systems  Constitutional: Negative.   HENT: Negative.   Eyes: Negative.   Respiratory: Negative.   Cardiovascular: Negative.   Gastrointestinal: Negative.   Genitourinary: Negative.   Musculoskeletal: Positive for back pain.  Skin: Negative.   Neurological: Negative.   Psychiatric/Behavioral: Negative.    Maternal Medical History:  Contractions: Frequency: irregular.    Fetal activity: Perceived fetal activity is normal.    Prenatal complications: IUGR.   Prenatal Complications - Diabetes: none.      not currently breastfeeding. Maternal Exam:  Uterine Assessment: Contraction frequency is irregular.   Abdomen: Patient reports no abdominal tenderness. Fundal height is SGA.   Estimated fetal weight is 4.5-5.5#.   Fetal presentation: vertex  Introitus: Normal vulva. Normal vagina.  Cervix: Cervix evaluated by digital exam.     Physical Exam  Constitutional: She is oriented to person, place, and time. She appears well-developed and well-nourished.  HENT:  Head: Normocephalic and atraumatic.  Cardiovascular: Normal rate and regular rhythm.  Respiratory: Effort normal and breath sounds normal. No respiratory distress. She has no wheezes.  GI: Soft. Bowel sounds are normal. She exhibits no distension. There is no abdominal tenderness.  Genitourinary:    Vulva normal.  Musculoskeletal: Normal range of motion.  Neurological: She is alert and oriented to person, place, and time.  Skin: Skin is warm and dry.  Psychiatric: She has a normal mood and affect. Her behavior is normal.    Prenatal labs: ABO, Rh: O/Negative/-- (11/11 0000) Antibody: Negative (11/11 0000) Rubella: Immune (11/11 0000) RPR: Nonreactive (11/11 0000)  HBsAg: Negative (11/11 0000)  HIV: Non-reactive (11/11 0000)  GBS:   negative  Hgb 12.9/Plt 350/Ur Cx neg/GC neg/Chl neg/Varicella immune/glucola  112/panorama low risk - female  US nl anat, post plac female  Flu at OB w/u Tdap and Rhogam 1/20  Assessment/Plan: 32 yo G2P1001 at 38+ w IUGR for IOL gbbs neg - no prophylaxis Pitocin and AROM to induce Epidural or IV pain meds prn Expect SVD D/w parents IUGR and possible difficulty w/ labor and size, poss NICU admit  Donna Harrison 07/20/2018, 10:20 PM    

## 2018-07-21 ENCOUNTER — Encounter (HOSPITAL_COMMUNITY): Payer: Self-pay

## 2018-07-21 ENCOUNTER — Inpatient Hospital Stay (HOSPITAL_COMMUNITY): Payer: PRIVATE HEALTH INSURANCE

## 2018-07-21 ENCOUNTER — Inpatient Hospital Stay (HOSPITAL_COMMUNITY): Payer: PRIVATE HEALTH INSURANCE | Admitting: Anesthesiology

## 2018-07-21 ENCOUNTER — Other Ambulatory Visit: Payer: Self-pay

## 2018-07-21 ENCOUNTER — Inpatient Hospital Stay (HOSPITAL_COMMUNITY)
Admission: AD | Admit: 2018-07-21 | Discharge: 2018-07-22 | DRG: 807 | Disposition: A | Discharge status: Home / Self Care | Payer: PRIVATE HEALTH INSURANCE | Attending: Obstetrics and Gynecology | Admitting: Obstetrics and Gynecology

## 2018-07-21 DIAGNOSIS — O365990 Maternal care for other known or suspected poor fetal growth, unspecified trimester, not applicable or unspecified: Secondary | ICD-10-CM | POA: Diagnosis present

## 2018-07-21 DIAGNOSIS — O365930 Maternal care for other known or suspected poor fetal growth, third trimester, not applicable or unspecified: Secondary | ICD-10-CM | POA: Diagnosis present

## 2018-07-21 DIAGNOSIS — O358XX0 Maternal care for other (suspected) fetal abnormality and damage, not applicable or unspecified: Secondary | ICD-10-CM | POA: Diagnosis present

## 2018-07-21 DIAGNOSIS — Z3A38 38 weeks gestation of pregnancy: Secondary | ICD-10-CM | POA: Diagnosis not present

## 2018-07-21 LAB — CBC
HCT: 35.9 % — ABNORMAL LOW (ref 36.0–46.0)
Hemoglobin: 11.4 g/dL — ABNORMAL LOW (ref 12.0–15.0)
MCH: 27.1 pg (ref 26.0–34.0)
MCHC: 31.8 g/dL (ref 30.0–36.0)
MCV: 85.5 fL (ref 80.0–100.0)
Platelets: 237 10*3/uL (ref 150–400)
RBC: 4.2 MIL/uL (ref 3.87–5.11)
RDW: 13.3 % (ref 11.5–15.5)
WBC: 10.7 10*3/uL — ABNORMAL HIGH (ref 4.0–10.5)
nRBC: 0 % (ref 0.0–0.2)

## 2018-07-21 LAB — RPR: RPR Ser Ql: NONREACTIVE

## 2018-07-21 LAB — OB RESULTS CONSOLE GBS: GBS: NEGATIVE

## 2018-07-21 MED ORDER — TERBUTALINE SULFATE 1 MG/ML IJ SOLN: 0.2500 mg | Freq: Once | INTRAMUSCULAR | Status: DC | PRN

## 2018-07-21 MED ORDER — LORATADINE 10 MG PO TABS: 10.0000 mg | ORAL_TABLET | Freq: Every day | ORAL | Status: DC

## 2018-07-21 MED ORDER — EPHEDRINE 5 MG/ML INJ: 10.0000 mg | INTRAVENOUS | Status: DC | PRN

## 2018-07-21 MED ORDER — ONDANSETRON HCL 4 MG PO TABS: 4.0000 mg | ORAL_TABLET | ORAL | Status: DC | PRN

## 2018-07-21 MED ORDER — LACTATED RINGERS IV SOLN: INTRAVENOUS | Status: DC

## 2018-07-21 MED ORDER — OXYTOCIN 40 UNITS IN NORMAL SALINE INFUSION - SIMPLE MED
1.0000 m[IU]/min | INTRAVENOUS | Status: DC
  Filled 2018-07-21: qty 1000

## 2018-07-21 MED ORDER — OXYTOCIN BOLUS FROM INFUSION
500.0000 mL | Freq: Once | INTRAVENOUS | Status: AC
  Administered 2018-07-21: 21:00:00 500 mL via INTRAVENOUS

## 2018-07-21 MED ORDER — SIMETHICONE 80 MG PO CHEW: 80.0000 mg | CHEWABLE_TABLET | ORAL | Status: DC | PRN

## 2018-07-21 MED ORDER — SENNOSIDES-DOCUSATE SODIUM 8.6-50 MG PO TABS
2.0000 | ORAL_TABLET | ORAL | Status: DC
  Administered 2018-07-22: 2 via ORAL
  Filled 2018-07-21: qty 2

## 2018-07-21 MED ORDER — OXYCODONE HCL 5 MG PO TABS: 5.0000 mg | ORAL_TABLET | ORAL | Status: DC | PRN

## 2018-07-21 MED ORDER — ACETAMINOPHEN 325 MG PO TABS: 650.0000 mg | ORAL_TABLET | ORAL | Status: DC | PRN

## 2018-07-21 MED ORDER — OXYTOCIN 40 UNITS IN NORMAL SALINE INFUSION - SIMPLE MED
1.0000 m[IU]/min | INTRAVENOUS | Status: DC
  Administered 2018-07-21: 3 m[IU]/min via INTRAVENOUS
  Administered 2018-07-21: 2 m[IU]/min via INTRAVENOUS

## 2018-07-21 MED ORDER — SODIUM CHLORIDE (PF) 0.9 % IJ SOLN
INTRAMUSCULAR | Status: DC | PRN
  Administered 2018-07-21: 12 mL/h via EPIDURAL

## 2018-07-21 MED ORDER — SOD CITRATE-CITRIC ACID 500-334 MG/5ML PO SOLN: 30.0000 mL | ORAL | Status: DC | PRN

## 2018-07-21 MED ORDER — BENZOCAINE-MENTHOL 20-0.5 % EX AERO
1.0000 "application " | INHALATION_SPRAY | CUTANEOUS | Status: DC | PRN
  Filled 2018-07-21: qty 56

## 2018-07-21 MED ORDER — DIPHENHYDRAMINE HCL 25 MG PO CAPS: 25.0000 mg | ORAL_CAPSULE | Freq: Four times a day (QID) | ORAL | Status: DC | PRN

## 2018-07-21 MED ORDER — ZOLPIDEM TARTRATE 5 MG PO TABS: 5.0000 mg | ORAL_TABLET | Freq: Every evening | ORAL | Status: DC | PRN

## 2018-07-21 MED ORDER — DIBUCAINE 1 % RE OINT: 1.0000 "application " | TOPICAL_OINTMENT | RECTAL | Status: DC | PRN

## 2018-07-21 MED ORDER — PHENYLEPHRINE 40 MCG/ML (10ML) SYRINGE FOR IV PUSH (FOR BLOOD PRESSURE SUPPORT): 80.0000 ug | PREFILLED_SYRINGE | INTRAVENOUS | Status: DC | PRN

## 2018-07-21 MED ORDER — OXYTOCIN 40 UNITS IN NORMAL SALINE INFUSION - SIMPLE MED: 2.5000 [IU]/h | INTRAVENOUS | Status: DC

## 2018-07-21 MED ORDER — PRENATAL MULTIVITAMIN CH
1.0000 | ORAL_TABLET | Freq: Every day | ORAL | Status: DC
  Filled 2018-07-21: qty 1

## 2018-07-21 MED ORDER — LACTATED RINGERS IV SOLN
INTRAVENOUS | Status: DC
  Administered 2018-07-21 (×2): via INTRAVENOUS

## 2018-07-21 MED ORDER — OXYCODONE HCL 5 MG PO TABS: 10.0000 mg | ORAL_TABLET | ORAL | Status: DC | PRN

## 2018-07-21 MED ORDER — WITCH HAZEL-GLYCERIN EX PADS: 1.0000 "application " | MEDICATED_PAD | CUTANEOUS | Status: DC | PRN

## 2018-07-21 MED ORDER — LIDOCAINE-EPINEPHRINE (PF) 2 %-1:200000 IJ SOLN
INTRAMUSCULAR | Status: DC | PRN
  Administered 2018-07-21: 2 mL via EPIDURAL
  Administered 2018-07-21: 5 mL via EPIDURAL

## 2018-07-21 MED ORDER — LACTATED RINGERS IV SOLN
500.0000 mL | Freq: Once | INTRAVENOUS | Status: AC
  Administered 2018-07-21: 500 mL via INTRAVENOUS

## 2018-07-21 MED ORDER — FENTANYL-BUPIVACAINE-NACL 0.5-0.125-0.9 MG/250ML-% EP SOLN
12.0000 mL/h | EPIDURAL | Status: DC | PRN
  Filled 2018-07-21: qty 250

## 2018-07-21 MED ORDER — ONDANSETRON HCL 4 MG/2ML IJ SOLN: 4.0000 mg | INTRAMUSCULAR | Status: DC | PRN

## 2018-07-21 MED ORDER — LIDOCAINE HCL (PF) 1 % IJ SOLN: 30.0000 mL | INTRAMUSCULAR | Status: DC | PRN

## 2018-07-21 MED ORDER — OXYCODONE-ACETAMINOPHEN 5-325 MG PO TABS: 2.0000 | ORAL_TABLET | ORAL | Status: DC | PRN

## 2018-07-21 MED ORDER — PHENYLEPHRINE 40 MCG/ML (10ML) SYRINGE FOR IV PUSH (FOR BLOOD PRESSURE SUPPORT)
80.0000 ug | PREFILLED_SYRINGE | INTRAVENOUS | Status: DC | PRN
  Filled 2018-07-21: qty 10

## 2018-07-21 MED ORDER — FLUOXETINE HCL 20 MG PO CAPS
40.0000 mg | ORAL_CAPSULE | Freq: Every day | ORAL | Status: DC
  Administered 2018-07-22: 10:00:00 40 mg via ORAL
  Filled 2018-07-21: qty 2

## 2018-07-21 MED ORDER — ONDANSETRON HCL 4 MG/2ML IJ SOLN: 4.0000 mg | Freq: Four times a day (QID) | INTRAMUSCULAR | Status: DC | PRN

## 2018-07-21 MED ORDER — LACTATED RINGERS IV SOLN: 500.0000 mL | INTRAVENOUS | Status: DC | PRN

## 2018-07-21 MED ORDER — IBUPROFEN 600 MG PO TABS
600.0000 mg | ORAL_TABLET | Freq: Four times a day (QID) | ORAL | Status: DC
  Administered 2018-07-22 (×3): 600 mg via ORAL
  Filled 2018-07-21 (×3): qty 1

## 2018-07-21 MED ORDER — BUTORPHANOL TARTRATE 1 MG/ML IJ SOLN: 1.0000 mg | INTRAMUSCULAR | Status: DC | PRN

## 2018-07-21 MED ORDER — OXYCODONE-ACETAMINOPHEN 5-325 MG PO TABS: 1.0000 | ORAL_TABLET | ORAL | Status: DC | PRN

## 2018-07-21 MED ORDER — DOCUSATE SODIUM 100 MG PO CAPS
100.0000 mg | ORAL_CAPSULE | Freq: Every day | ORAL | Status: DC
  Administered 2018-07-22: 05:00:00 100 mg via ORAL
  Filled 2018-07-21: qty 1

## 2018-07-21 MED ORDER — DIPHENHYDRAMINE HCL 50 MG/ML IJ SOLN: 12.5000 mg | INTRAMUSCULAR | Status: DC | PRN

## 2018-07-21 MED ORDER — COCONUT OIL OIL: 1.0000 "application " | TOPICAL_OIL | Status: DC | PRN

## 2018-07-21 NOTE — Progress Notes (Addendum)
Patient ID: Donna Harrison, female   DOB: 08/24/1985, 33 y.o.   MRN: [id number]   Pt's foley bulb came out.  Walked for a bit, ready for ROM  AFVSS gen NAD FHTs 120-130's, mod var, +accels, category 1 toco q  AROM for clear fluid, w/o diff/comp  Will consider starting pitocin if no more cervical change 6.7/70-80/-1-0  Expect SVD Cont close monitoring.

## 2018-07-21 NOTE — Anesthesia Procedure Notes (Signed)
Epidural Patient location during procedure: OB Start time: 07/21/2018 5:39 PM End time: 07/21/2018 5:55 PM  Staffing Anesthesiologist: Lucretia Kern, MD Performed: anesthesiologist   Preanesthetic Checklist Completed: patient identified, pre-op evaluation, timeout performed, IV checked, risks and benefits discussed and monitors and equipment checked  Epidural Patient position: sitting Prep: DuraPrep Patient monitoring: heart rate, continuous pulse ox and blood pressure Approach: midline Location: L3-L4 Injection technique: LOR air  Needle:  Needle type: Tuohy  Needle gauge: 17 G Needle length: 9 cm Needle insertion depth: 4 cm Catheter type: closed end flexible Catheter size: 19 Gauge Catheter at skin depth: 9 cm Test dose: negative and 2% lidocaine with Epi 1:200 K  Assessment Events: blood not aspirated, injection not painful, no injection resistance, negative IV test and no paresthesia  Additional Notes Reason for block:procedure for pain

## 2018-07-21 NOTE — Anesthesia Preprocedure Evaluation (Signed)
Anesthesia Evaluation  Patient identified by MRN, date of birth, ID band Patient awake    Reviewed: Allergy & Precautions, H&P , NPO status , Patient's Chart, lab work & pertinent test results  History of Anesthesia Complications Negative for: history of anesthetic complications  Airway Mallampati: II  TM Distance: >3 FB Neck ROM: full    Dental no notable dental hx.    Pulmonary neg pulmonary ROS,    Pulmonary exam normal        Cardiovascular negative cardio ROS Normal cardiovascular exam Rhythm:regular Rate:Normal     Neuro/Psych PSYCHIATRIC DISORDERS Anxiety Depression negative neurological ROS     GI/Hepatic negative GI ROS, Neg liver ROS,   Endo/Other  negative endocrine ROS  Renal/GU negative Renal ROS  negative genitourinary   Musculoskeletal   Abdominal   Peds  Hematology negative hematology ROS (+)   Anesthesia Other Findings   Reproductive/Obstetrics (+) Pregnancy                             Anesthesia Physical Anesthesia Plan  ASA: II  Anesthesia Plan: Epidural   Post-op Pain Management:    Induction:   PONV Risk Score and Plan:   Airway Management Planned:   Additional Equipment:   Intra-op Plan:   Post-operative Plan:   Informed Consent: I have reviewed the patients History and Physical, chart, labs and discussed the procedure including the risks, benefits and alternatives for the proposed anesthesia with the patient or authorized representative who has indicated his/her understanding and acceptance.       Plan Discussed with:   Anesthesia Plan Comments:         Anesthesia Quick Evaluation  

## 2018-07-21 NOTE — Interval H&P Note (Signed)
History and Physical Interval Note:  07/21/2018 10:07 AM  Donna Harrison  has presented today for IOL. The patient's history has been reviewed, patient examined, no change in status, stable for IOL.  I have reviewed the patient's chart and labs. D/W pt and partner IUGR infant and IOL - poss of LTCS and need for NICU eval if <4.8# Questions were answered to the patient's satisfaction.     Maisen Klingler Bovard-Stuckert

## 2018-07-21 NOTE — Progress Notes (Addendum)
Patient ID: Donna Harrison, female   DOB: 1985/07/11, 33 y.o.   MRN: [id number]   Doing well  AFVSS gen NAD FHTs 115-120's, mod var, + accels, category 1 toco irr  Foley bulb in place  Normal bleeding, not excessive  (late entry)

## 2018-07-21 NOTE — Progress Notes (Addendum)
Patient ID: Donna Harrison, female   DOB: 21-Jan-1986, 33 y.o.   MRN: [id number]   H&P reviewed, no change IOL for IUGR  AFVSS gen NAD FHTs 120's mod var, category 1 toco irr  Foley bulb placed, some bloody fluid. Likely ROM,  No other complication - Close monitoring

## 2018-07-22 LAB — CBC
HCT: 32 % — ABNORMAL LOW (ref 36.0–46.0)
Hemoglobin: 10.1 g/dL — ABNORMAL LOW (ref 12.0–15.0)
MCH: 27.2 pg (ref 26.0–34.0)
MCHC: 31.6 g/dL (ref 30.0–36.0)
MCV: 86.3 fL (ref 80.0–100.0)
Platelets: 221 10*3/uL (ref 150–400)
RBC: 3.71 MIL/uL — ABNORMAL LOW (ref 3.87–5.11)
RDW: 13.6 % (ref 11.5–15.5)
WBC: 16 10*3/uL — ABNORMAL HIGH (ref 4.0–10.5)
nRBC: 0 % (ref 0.0–0.2)

## 2018-07-22 MED ORDER — IBUPROFEN 600 MG PO TABS: 600.0000 mg | ORAL_TABLET | Freq: Four times a day (QID) | ORAL | 1 refills | Status: DC | PRN

## 2018-07-22 MED ORDER — RHO D IMMUNE GLOBULIN 1500 UNIT/2ML IJ SOSY
300.0000 ug | PREFILLED_SYRINGE | Freq: Once | INTRAMUSCULAR | Status: AC
  Administered 2018-07-22: 300 ug via INTRAVENOUS
  Filled 2018-07-22: qty 2

## 2018-07-22 NOTE — Discharge Instructions (Signed)
Call office with any concerns [phone] 

## 2018-07-22 NOTE — Progress Notes (Signed)
MOB was referred for history of depression/anxiety.  * Referral screened out by Clinical Social Worker because none of the following criteria appear to apply:  ~ History of anxiety/depression during this pregnancy, or of post-partum depression following prior delivery. ~ Diagnosis of anxiety and/or depression within last 3 years OR * MOB's symptoms currently being treated with medication and/or therapy. MOB currently on Prozac.  Please contact the Clinical Social Worker if needs arise or by MOB request.  Louine Tenpenny Irwin, LCSWA  [facility] [phone]  

## 2018-07-22 NOTE — Anesthesia Postprocedure Evaluation (Signed)
Anesthesia Post Note  Patient: Donna Harrison  Procedure(s) Performed: AN AD HOC LABOR EPIDURAL     Patient location during evaluation: Mother Baby Anesthesia Type: Epidural Level of consciousness: awake and oriented Pain management: pain level controlled Vital Signs Assessment: post-procedure vital signs reviewed and stable Respiratory status: spontaneous breathing and respiratory function stable Cardiovascular status: blood pressure returned to baseline Postop Assessment: no headache Anesthetic complications: no    Last Vitals:  Vitals:   07/22/18 0045 07/22/18 0512  BP: 114/71 105/67  Pulse: 63 63  Resp: 16 16  Temp: 36.6 C 36.7 C    Last Pain:  Vitals:   07/22/18 0735  TempSrc:   PainSc: 0-No pain   Pain Goal:                   Romi Rathel

## 2018-07-22 NOTE — Discharge Summary (Signed)
OB Discharge Summary     Patient Name: Donna Harrison DOB: [birth year] MRN: [id number]  Date of admission: 07/21/2018 Delivering MD: Sherian Rein   Date of discharge: 07/22/2018  Admitting diagnosis: pregnancy Intrauterine pregnancy: [redacted]w[redacted]d     Secondary diagnosis:  Principal Problem:   SVD (spontaneous vaginal delivery) Active Problems:   IUGR (intrauterine growth restriction) affecting care of mother  Additional problems: none     Discharge diagnosis: Term Pregnancy Delivered and IUGR                                                                                                Post partum procedures:rhogam  Augmentation: AROM and Pitocin  Complications: None  Hospital course:  Induction of Labor With Vaginal Delivery   33 y.o. yo G2P2002 at [redacted]w[redacted]d was admitted to the hospital 07/21/2018 for induction of labor.  Indication for induction: iugr.  Patient had an uncomplicated labor course as follows: Membrane Rupture Time/Date: 5:16 PM ,07/21/2018   Intrapartum Procedures: Episiotomy: None [1]                                         Lacerations:  1st degree [2];Perineal [11]  Patient had delivery of a Viable infant.  Information for the patient's newborn:  Ditya, Pultz [[id number]]  Delivery Method: Vag-Spont   07/21/2018  Details of delivery can be found in separate delivery note.  Patient had a routine postpartum course. Patient is discharged home 07/22/18.  Physical exam  Vitals:   07/22/18 0512 07/22/18 0855 07/22/18 1239 07/22/18 1445  BP: 105/67 110/68 112/68 106/67  Pulse: 63 64 70 66  Resp: 16 16 17 15   Temp: 98 F (36.7 C) 98 F (36.7 C) 98.1 F (36.7 C) 98.2 F (36.8 C)  TempSrc: Axillary Oral Oral Oral  SpO2:    100%  Weight:      Height:       General: alert, cooperative and no distress Lochia: appropriate Uterine Fundus: firm Incision: N/A DVT Evaluation: No evidence of DVT seen on physical exam. Labs: Lab Results  Component Value  Date   WBC 16.0 (H) 07/22/2018   HGB 10.1 (L) 07/22/2018   HCT 32.0 (L) 07/22/2018   MCV 86.3 07/22/2018   PLT 221 07/22/2018   CMP Latest Ref Rng & Units 04/11/2017  Glucose 65 - 99 mg/dL 90  BUN 6 - 20 mg/dL 12  Creatinine 7.82 - 9.56 mg/dL 2.13  Sodium 086 - 578 mmol/L 132(L)  Potassium 3.5 - 5.1 mmol/L 4.7  Chloride 101 - 111 mmol/L 100(L)  CO2 22 - 32 mmol/L 21(L)  Calcium 8.9 - 10.3 mg/dL 4.6(N)  Total Protein 6.5 - 8.1 g/dL 6.1(L)  Total Bilirubin 0.3 - 1.2 mg/dL 0.5  Alkaline Phos 38 - 126 U/L 196(H)  AST 15 - 41 U/L 24  ALT 14 - 54 U/L 12(L)    Discharge instruction: per After Visit Summary and "Baby and Me Booklet".  After visit meds:  Allergies as of 07/22/2018  Reactions   Pollen Extract Other (See Comments)   Stuffy nose      Medication List    TAKE these medications   cetirizine 5 MG tablet Commonly known as:  ZYRTEC Take 5 mg by mouth daily as needed for allergies.   docusate sodium 100 MG capsule Commonly known as:  COLACE Take 100 mg by mouth at bedtime.   FLUoxetine 40 MG capsule Commonly known as:  PROZAC Take 1 capsule (40 mg total) by mouth daily.   ibuprofen 600 MG tablet Commonly known as:  ADVIL,MOTRIN Take 1 tablet (600 mg total) by mouth every 6 (six) hours as needed for cramping.   prenatal multivitamin Tabs tablet Take 1 tablet by mouth daily at 12 noon.   Vitamin D 50 MCG (2000 UT) tablet Take 2,000 Units by mouth daily.       Diet: routine diet  Activity: Advance as tolerated. Pelvic rest for 6 weeks.   Outpatient follow up:6 weeks Follow up Appt: Future Appointments  Date Time Provider Department Center  08/24/2018  1:00 PM Mendelson, Suzie Portela None   Follow up Visit:No follow-ups on file.  Postpartum contraception: Not Discussed  Newborn Data: Live born female  Birth Weight: 5 lb 3.1 oz (2356 g) APGAR: 8, 9  Newborn Delivery   Birth date/time:  07/21/2018 20:58:00 Delivery type:  Vaginal,  Spontaneous     Baby Feeding: Bottle Disposition:home with mother   07/22/2018 [name], DO

## 2018-07-22 NOTE — Progress Notes (Signed)
Patient ID: Donna Harrison, female   DOB: 1985/08/03, 33 y.o.   MRN: [id number] Pt doing well. Pain well controlled. Lochia mild. Denies fever/chills/SOB/CP/HA. Ambulating and voiding well. Bonding well with baby - pumping. Requests early discharge at 24hrs tonight  VSS ABD - FF and 4cm below umbilicus EXT - no homans  16>10.1<221  A/P: PPD#1 s/p svd - stable          Routine pp care         Discharge to home after 24hrs postpartum tonight - instructions reviewed

## 2018-07-22 NOTE — Progress Notes (Signed)
2210 Infant strapped in car seat by parents. Bracelet matched to mother's bracelet. Hugs tag removed at nurses' station. This RN walked infant and Mother to family's car.

## 2018-07-23 LAB — TYPE AND SCREEN
ABO/RH(D): O NEG
Antibody Screen: POSITIVE
Unit division: 0
Unit division: 0

## 2018-07-23 LAB — RH IG WORKUP (INCLUDES ABO/RH)
ABO/RH(D): O NEG
Fetal Screen: NEGATIVE
Gestational Age(Wks): 38
Unit division: 0

## 2018-07-23 LAB — BPAM RBC
Blood Product Expiration Date: 202004272359
Blood Product Expiration Date: 202004272359
Unit Type and Rh: 9500
Unit Type and Rh: 9500

## 2018-08-07 ENCOUNTER — Encounter: Payer: Self-pay | Admitting: Physician Assistant

## 2018-08-10 ENCOUNTER — Encounter: Payer: Self-pay | Admitting: Physician Assistant

## 2018-08-10 ENCOUNTER — Other Ambulatory Visit: Payer: Self-pay

## 2018-08-10 ENCOUNTER — Ambulatory Visit (INDEPENDENT_AMBULATORY_CARE_PROVIDER_SITE_OTHER): Payer: PRIVATE HEALTH INSURANCE | Admitting: Physician Assistant

## 2018-08-10 DIAGNOSIS — F331 Major depressive disorder, recurrent, moderate: Secondary | ICD-10-CM | POA: Diagnosis not present

## 2018-08-10 MED ORDER — FLUOXETINE HCL 40 MG PO CAPS: 40.0000 mg | ORAL_CAPSULE | Freq: Every day | ORAL | 1 refills | Status: AC

## 2018-08-10 NOTE — Progress Notes (Signed)
   Virtual Visit via Video   I connected with patient on 08/10/18 at  2:00 PM EDT by a video enabled telemedicine application and verified that I am speaking with the correct person using two identifiers.  Location patient: Home Location provider: [facility], Office Persons participating in the virtual visit: Patient, Provider, CMA (Patina Moore)  I discussed the limitations of evaluation and management by telemedicine and the availability of in person appointments. The patient expressed understanding and agreed to proceed.  Subjective:   HPI:   Patient presents via [employer] today for follow-up of anxiety/depression. Patient on long-standing regimen of Fluoxetine 40 mg daily. Endorses taking as directed. OB kept her on throughout pregnancy and after due to history of postpartum depression as well. She is breast feeding but is already weaning down from this to move to bottle feeding. Notes mood is doing very well overall. Is eating and sleeping well. No anxiety. Denies SI/HI. Denies any breakthrough anhedonia or depressed mood. Getting along well with both children.   ROS:   See pertinent positives and negatives per HPI.  Patient Active Problem List   Diagnosis Date Noted  . IUGR (intrauterine growth restriction) affecting care of mother 07/21/2018  . SVD (spontaneous vaginal delivery) 07/21/2018  . Moderate episode of recurrent major depressive disorder (HCC) 09/04/2017  . Normal labor 04/11/2017  . Postpartum care following vaginal delivery 04/11/2017  . OCD (obsessive compulsive disorder) 02/09/2014    Social History   Tobacco Use  . Smoking status: Never Smoker  . Smokeless tobacco: Never Used  Substance Use Topics  . Alcohol use: No    Current Outpatient Medications:  .  cetirizine (ZYRTEC) 5 MG tablet, Take 5 mg by mouth daily as needed for allergies. , Disp: , Rfl:  .  Cholecalciferol (VITAMIN D) 2000 units tablet, Take 2,000 Units by mouth daily., Disp: , Rfl:   .  docusate sodium (COLACE) 100 MG capsule, Take 100 mg by mouth at bedtime., Disp: , Rfl:  .  FLUoxetine (PROZAC) 40 MG capsule, Take 1 capsule (40 mg total) by mouth daily., Disp: 90 capsule, Rfl: 1 .  Prenatal Vit-Fe Fumarate-FA (PRENATAL MULTIVITAMIN) TABS tablet, Take 1 tablet by mouth daily at 12 noon., Disp: , Rfl:   Allergies  Allergen Reactions  . Pollen Extract Other (See Comments)    Stuffy nose    Objective:   BP 103/63   Pulse (!) 59   Ht 5\' 7"  (1.702 m)   Wt 133 lb (60.3 kg)   Breastfeeding No   BMI 20.83 kg/m   Patient is well-developed, well-nourished in no acute distress.  Resting comfortably in chair at home.  Head is normocephalic, atraumatic.  No labored breathing.  Speech is clear and coherent with logical contest.  Patient is alert and oriented at baseline.   Assessment and Plan:        1. Moderate episode of recurrent major depressive disorder (HCC) Doing well on current regimen. Discussed risks and benefits of SSRI with breastfeeding. She is MD and already aware but reiterated. However giving her past history, especially of postpartum depression along with her MDD, I agree with OB that it is reasonable to continue her Fluoxetine. - FLUoxetine (PROZAC) 40 MG capsule; Take 1 capsule (40 mg total) by mouth daily.  Dispense: 90 capsule; Refill: 1    Donna Harrison, [place] 08/10/2018

## 2018-08-10 NOTE — Progress Notes (Signed)
I have discussed the procedure for the virtual visit with the patient who has given consent to proceed with assessment and treatment.   Donna Harrison S Dhara Schepp, CMA     

## 2018-08-24 ENCOUNTER — Ambulatory Visit: Payer: No Typology Code available for payment source | Admitting: Clinical

## 2018-09-05 ENCOUNTER — Ambulatory Visit (INDEPENDENT_AMBULATORY_CARE_PROVIDER_SITE_OTHER): Payer: No Typology Code available for payment source | Admitting: Clinical

## 2018-09-05 DIAGNOSIS — F4323 Adjustment disorder with mixed anxiety and depressed mood: Secondary | ICD-10-CM | POA: Diagnosis not present

## 2018-09-26 ENCOUNTER — Ambulatory Visit (INDEPENDENT_AMBULATORY_CARE_PROVIDER_SITE_OTHER): Payer: No Typology Code available for payment source | Admitting: Clinical

## 2018-09-26 DIAGNOSIS — F4323 Adjustment disorder with mixed anxiety and depressed mood: Secondary | ICD-10-CM

## 2018-10-02 ENCOUNTER — Ambulatory Visit (INDEPENDENT_AMBULATORY_CARE_PROVIDER_SITE_OTHER): Payer: No Typology Code available for payment source | Admitting: Clinical

## 2018-10-02 DIAGNOSIS — F4323 Adjustment disorder with mixed anxiety and depressed mood: Secondary | ICD-10-CM

## 2018-10-22 ENCOUNTER — Ambulatory Visit (INDEPENDENT_AMBULATORY_CARE_PROVIDER_SITE_OTHER): Payer: No Typology Code available for payment source | Admitting: Clinical

## 2018-10-22 DIAGNOSIS — F4323 Adjustment disorder with mixed anxiety and depressed mood: Secondary | ICD-10-CM

## 2018-11-06 ENCOUNTER — Ambulatory Visit (INDEPENDENT_AMBULATORY_CARE_PROVIDER_SITE_OTHER): Payer: No Typology Code available for payment source | Admitting: Clinical

## 2018-11-06 DIAGNOSIS — F4323 Adjustment disorder with mixed anxiety and depressed mood: Secondary | ICD-10-CM

## 2018-11-28 ENCOUNTER — Ambulatory Visit (INDEPENDENT_AMBULATORY_CARE_PROVIDER_SITE_OTHER): Payer: No Typology Code available for payment source | Admitting: Clinical

## 2018-11-28 DIAGNOSIS — F4323 Adjustment disorder with mixed anxiety and depressed mood: Secondary | ICD-10-CM

## 2018-12-24 ENCOUNTER — Other Ambulatory Visit: Payer: Self-pay | Admitting: Emergency Medicine

## 2019-01-01 ENCOUNTER — Ambulatory Visit (INDEPENDENT_AMBULATORY_CARE_PROVIDER_SITE_OTHER): Payer: No Typology Code available for payment source | Admitting: Clinical

## 2019-01-01 DIAGNOSIS — F4323 Adjustment disorder with mixed anxiety and depressed mood: Secondary | ICD-10-CM

## 2019-01-25 ENCOUNTER — Ambulatory Visit: Payer: No Typology Code available for payment source | Admitting: Clinical

## 2019-03-08 ENCOUNTER — Ambulatory Visit (INDEPENDENT_AMBULATORY_CARE_PROVIDER_SITE_OTHER): Payer: No Typology Code available for payment source | Admitting: Clinical

## 2019-03-08 DIAGNOSIS — F4323 Adjustment disorder with mixed anxiety and depressed mood: Secondary | ICD-10-CM | POA: Diagnosis not present

## 2019-03-28 ENCOUNTER — Ambulatory Visit: Payer: No Typology Code available for payment source | Admitting: Clinical

## 2019-04-02 ENCOUNTER — Ambulatory Visit (INDEPENDENT_AMBULATORY_CARE_PROVIDER_SITE_OTHER): Payer: No Typology Code available for payment source | Admitting: Clinical

## 2019-04-02 DIAGNOSIS — F4323 Adjustment disorder with mixed anxiety and depressed mood: Secondary | ICD-10-CM

## 2019-05-16 ENCOUNTER — Ambulatory Visit (INDEPENDENT_AMBULATORY_CARE_PROVIDER_SITE_OTHER): Payer: No Typology Code available for payment source | Admitting: Clinical

## 2019-05-16 DIAGNOSIS — F4323 Adjustment disorder with mixed anxiety and depressed mood: Secondary | ICD-10-CM

## 2019-05-31 ENCOUNTER — Ambulatory Visit (INDEPENDENT_AMBULATORY_CARE_PROVIDER_SITE_OTHER): Payer: No Typology Code available for payment source | Admitting: Clinical

## 2019-05-31 DIAGNOSIS — F4323 Adjustment disorder with mixed anxiety and depressed mood: Secondary | ICD-10-CM | POA: Diagnosis not present

## 2019-06-27 ENCOUNTER — Ambulatory Visit (INDEPENDENT_AMBULATORY_CARE_PROVIDER_SITE_OTHER): Payer: No Typology Code available for payment source | Admitting: Clinical

## 2019-06-27 DIAGNOSIS — F4323 Adjustment disorder with mixed anxiety and depressed mood: Secondary | ICD-10-CM

## 2019-07-18 ENCOUNTER — Ambulatory Visit (INDEPENDENT_AMBULATORY_CARE_PROVIDER_SITE_OTHER): Payer: No Typology Code available for payment source | Admitting: Clinical

## 2019-07-18 DIAGNOSIS — F4323 Adjustment disorder with mixed anxiety and depressed mood: Secondary | ICD-10-CM | POA: Diagnosis not present

## 2019-10-10 ENCOUNTER — Ambulatory Visit (INDEPENDENT_AMBULATORY_CARE_PROVIDER_SITE_OTHER): Payer: No Typology Code available for payment source | Admitting: Clinical

## 2019-10-10 DIAGNOSIS — F4323 Adjustment disorder with mixed anxiety and depressed mood: Secondary | ICD-10-CM

## 2019-10-24 ENCOUNTER — Ambulatory Visit: Payer: No Typology Code available for payment source | Admitting: Clinical

## 2020-01-31 ENCOUNTER — Ambulatory Visit (INDEPENDENT_AMBULATORY_CARE_PROVIDER_SITE_OTHER): Payer: No Typology Code available for payment source | Admitting: Clinical

## 2020-01-31 DIAGNOSIS — F4323 Adjustment disorder with mixed anxiety and depressed mood: Secondary | ICD-10-CM | POA: Diagnosis not present

## 2020-03-06 ENCOUNTER — Ambulatory Visit: Payer: No Typology Code available for payment source | Admitting: Clinical

## 2020-08-06 ENCOUNTER — Telehealth: Payer: Self-pay

## 2020-08-06 NOTE — Telephone Encounter (Signed)
LVM about getting scheduled for a TOC. 

## 2021-01-31 ENCOUNTER — Emergency Department (HOSPITAL_BASED_OUTPATIENT_CLINIC_OR_DEPARTMENT_OTHER)
Admission: EM | Admit: 2021-01-31 | Discharge: 2021-01-31 | Disposition: A | Discharge status: Home / Self Care | Payer: PRIVATE HEALTH INSURANCE | Attending: Emergency Medicine | Admitting: Emergency Medicine

## 2021-01-31 ENCOUNTER — Encounter (HOSPITAL_BASED_OUTPATIENT_CLINIC_OR_DEPARTMENT_OTHER): Payer: Self-pay

## 2021-01-31 ENCOUNTER — Other Ambulatory Visit: Payer: Self-pay

## 2021-01-31 ENCOUNTER — Emergency Department (HOSPITAL_BASED_OUTPATIENT_CLINIC_OR_DEPARTMENT_OTHER): Payer: PRIVATE HEALTH INSURANCE

## 2021-01-31 DIAGNOSIS — S0181XA Laceration without foreign body of other part of head, initial encounter: Secondary | ICD-10-CM

## 2021-01-31 DIAGNOSIS — S01111A Laceration without foreign body of right eyelid and periocular area, initial encounter: Secondary | ICD-10-CM | POA: Diagnosis present

## 2021-01-31 DIAGNOSIS — Y92009 Unspecified place in unspecified non-institutional (private) residence as the place of occurrence of the external cause: Secondary | ICD-10-CM | POA: Diagnosis not present

## 2021-01-31 DIAGNOSIS — I951 Orthostatic hypotension: Secondary | ICD-10-CM | POA: Insufficient documentation

## 2021-01-31 DIAGNOSIS — S0990XA Unspecified injury of head, initial encounter: Secondary | ICD-10-CM | POA: Diagnosis not present

## 2021-01-31 DIAGNOSIS — W0110XA Fall on same level from slipping, tripping and stumbling with subsequent striking against unspecified object, initial encounter: Secondary | ICD-10-CM | POA: Diagnosis not present

## 2021-01-31 DIAGNOSIS — N9489 Other specified conditions associated with female genital organs and menstrual cycle: Secondary | ICD-10-CM | POA: Diagnosis not present

## 2021-01-31 LAB — BASIC METABOLIC PANEL
Anion gap: 9 (ref 5–15)
BUN: 14 mg/dL (ref 6–20)
CO2: 28 mmol/L (ref 22–32)
Calcium: 9.5 mg/dL (ref 8.9–10.3)
Chloride: 100 mmol/L (ref 98–111)
Creatinine, Ser: 0.87 mg/dL (ref 0.44–1.00)
GFR, Estimated: >60 mL/min (ref >60)
Glucose, Bld: 97 mg/dL (ref 70–99)
Potassium: 3.7 mmol/L (ref 3.5–5.1)
Sodium: 137 mmol/L (ref 135–145)

## 2021-01-31 LAB — CBC WITH DIFFERENTIAL/PLATELET
Abs Immature Granulocytes: 0.04 10*3/uL (ref 0.00–0.07)
Basophils Absolute: 0.1 10*3/uL (ref 0.0–0.1)
Basophils Relative: 1 %
Eosinophils Absolute: 0.8 10*3/uL — ABNORMAL HIGH (ref 0.0–0.5)
Eosinophils Relative: 6 %
HCT: 43.6 % (ref 36.0–46.0)
Hemoglobin: 14.5 g/dL (ref 12.0–15.0)
Immature Granulocytes: 0 %
Lymphocytes Relative: 19 %
Lymphs Abs: 2.6 10*3/uL (ref 0.7–4.0)
MCH: 28.7 pg (ref 26.0–34.0)
MCHC: 33.3 g/dL (ref 30.0–36.0)
MCV: 86.3 fL (ref 80.0–100.0)
Monocytes Absolute: 0.9 10*3/uL (ref 0.1–1.0)
Monocytes Relative: 6 %
Neutro Abs: 9.7 10*3/uL — ABNORMAL HIGH (ref 1.7–7.7)
Neutrophils Relative %: 68 %
Platelets: 344 10*3/uL (ref 150–400)
RBC: 5.05 MIL/uL (ref 3.87–5.11)
RDW: 13 % (ref 11.5–15.5)
WBC: 14.2 10*3/uL — ABNORMAL HIGH (ref 4.0–10.5)
nRBC: 0 % (ref 0.0–0.2)

## 2021-01-31 LAB — HCG, SERUM, QUALITATIVE: Preg, Serum: NEGATIVE

## 2021-01-31 MED ORDER — LIDOCAINE-EPINEPHRINE (PF) 2 %-1:200000 IJ SOLN
INTRAMUSCULAR | Status: AC
  Filled 2021-01-31: qty 20

## 2021-01-31 MED ORDER — SODIUM CHLORIDE 0.9 % IV BOLUS
1000.0000 mL | Freq: Once | INTRAVENOUS | Status: AC
  Administered 2021-01-31: 1000 mL via INTRAVENOUS

## 2021-01-31 MED ORDER — SODIUM CHLORIDE 0.9 % IV BOLUS: 500.0000 mL | Freq: Once | INTRAVENOUS | Status: DC

## 2021-01-31 MED ORDER — IBUPROFEN 800 MG PO TABS
800.0000 mg | ORAL_TABLET | Freq: Once | ORAL | Status: AC
  Administered 2021-01-31: 800 mg via ORAL
  Filled 2021-01-31: qty 1

## 2021-01-31 MED ORDER — NAPROXEN 500 MG PO TABS: 500.0000 mg | ORAL_TABLET | Freq: Two times a day (BID) | ORAL | 0 refills | Status: AC

## 2021-01-31 NOTE — ED Notes (Signed)
Dr. Nicanor Alcon uses sterile technique, size 7 sterile gloves to insert 3 absorbable stitches into patients right eyebrow. Patient denies discomfort at this time.

## 2021-01-31 NOTE — ED Notes (Signed)
Patient offered apple juice for PO Challenge.

## 2021-01-31 NOTE — ED Notes (Signed)
Dr. Nicanor Alcon at bedside to suture laceration.

## 2021-01-31 NOTE — Discharge Instructions (Addendum)
Please hold your spironolactone for 48 hours.   If possible, increase your liquid intake to 6-12 8 ounce glasses of water, juice and/or or Gatorade  Do not submerge the laceration in any body of water for 2 weeks.  You may shower in 12 hours.  No facial cosmetics or lotions for 72 hours.  It was a pleasure to care for you today, we hope you feel better soon!

## 2021-01-31 NOTE — ED Notes (Signed)
PO challenge successful. MD notified

## 2021-01-31 NOTE — ED Provider Notes (Signed)
[facility] EMERGENCY DEPT Provider Note   CSN: [id number] Arrival date & time: 01/31/21  0307     History Chief Complaint  Patient presents with   Loss of Consciousness    Donna Harrison is a 35 y.o. female.  The history is provided by the patient and a relative.  Loss of Consciousness Episode history:  Single Most recent episode:  Today Timing:  Rare Progression:  Resolved Chronicity:  Recurrent Context: standing up   Witnessed: yes (by her husband)   Worsened by:  Nothing Ineffective treatments:  None tried Associated symptoms: no chest pain, no confusion, no diaphoresis, no fever, no seizures, no shortness of breath, no visual change and no vomiting   Risk factors: no seizures   Patient is a pleasant 35 year old female who presents from home with a witnessed syncopal event.  She reports she was getting out of bed to check on her toddler when she passed out. When this occurred she struck her head causing a laceration within the right eyebrow. Of note, the patient has had her COVID-19 booster within 24 hours and has recently started Pristiq, which is a new medication for her. She also notes that she hasn't had much liquid intake today.  Patient took Tylenol prior to arrival for pain. Tetanus is up to date.     Past Medical History:  Diagnosis Date   ADHD (attention deficit hyperactivity disorder)    Allergy    Anxiety    Depression    prozac 40mg    Hypersomnia, idiopathic    PCOS (polycystic ovarian syndrome)    fertility specialist.    SVD (spontaneous vaginal delivery) 07/21/2018    Patient Active Problem List   Diagnosis Date Noted   IUGR (intrauterine growth restriction) affecting care of mother 07/21/2018   SVD (spontaneous vaginal delivery) 07/21/2018   Moderate episode of recurrent major depressive disorder (HCC) 09/04/2017   Normal labor 04/11/2017   Postpartum care following vaginal delivery 04/11/2017   OCD (obsessive compulsive disorder)  02/09/2014    Past Surgical History:  Procedure Laterality Date   APPENDECTOMY  02/23/2007   TONSILLECTOMY AND ADENOIDECTOMY  05/22/2008     OB History     Gravida  2   Para  2   Term  2   Preterm  0   AB  0   Living  2      SAB  0   IAB  0   Ectopic  0   Multiple  0   Live Births  2           Family History  Problem Relation Age of Onset   Healthy Mother    Hyperlipidemia Father    Hypothyroidism Father    Healthy Brother    Heart attack Maternal Uncle 50   Heart attack Paternal Uncle 64       Very inactive lifestyle, positive smoking history.    Cancer Maternal Grandmother        Breast - postmenopausal   Cancer Maternal Grandfather        Stomach   Hyperlipidemia Paternal Grandmother    Diabetes Paternal Grandmother    Heart block Paternal Grandmother    Hypothyroidism Paternal Grandmother    Cancer Paternal Grandfather        Kidney    Social History   Tobacco Use   Smoking status: Never   Smokeless tobacco: Never  Vaping Use   Vaping Use: Never used  Substance Use Topics  Alcohol use: No   Drug use: No    Home Medications Prior to Admission medications   Medication Sig Start Date End Date Taking? Authorizing Provider  naproxen (NAPROSYN) 500 MG tablet Take 1 tablet (500 mg total) by mouth 2 (two) times daily with a meal. 01/31/21  Yes Arnulfo Batson, MD  cetirizine (ZYRTEC) 5 MG tablet Take 5 mg by mouth daily as needed for allergies.     [provider]  Cholecalciferol (VITAMIN D) 2000 units tablet Take 2,000 Units by mouth daily.    [provider]  docusate sodium (COLACE) 100 MG capsule Take 100 mg by mouth at bedtime.    [provider]  FLUoxetine (PROZAC) 40 MG capsule Take 1 capsule (40 mg total) by mouth daily. 08/10/18   Waldon Merl, PA-C  Prenatal Vit-Fe Fumarate-FA (PRENATAL MULTIVITAMIN) TABS tablet Take 1 tablet by mouth daily at 12 noon.    [provider]    Allergies     Pollen extract  Review of Systems   Review of Systems  Constitutional:  Negative for diaphoresis and fever.  HENT:  Negative for sore throat.        Facial laceration, right eyebrow  Eyes:  Negative for redness and visual disturbance.  Respiratory:  Negative for shortness of breath.   Cardiovascular:  Positive for syncope. Negative for chest pain.  Gastrointestinal:  Negative for vomiting.  Genitourinary:  Negative for dysuria and frequency.  Musculoskeletal:  Negative for gait problem.  Skin:  Positive for wound.  Neurological:  Negative for seizures.  Psychiatric/Behavioral:  Negative for confusion.   All other systems reviewed and are negative.  Physical Exam Updated Vital Signs BP 97/64   Pulse 81   Temp 97.9 F (36.6 C) (Oral)   Resp 18   Ht 5\' 6"  (1.676 m)   Wt 59 kg   SpO2 100%   BMI 20.98 kg/m   Physical Exam Vitals and nursing note reviewed. Exam conducted with a chaperone present (Nurse , present throughout H/P).  Constitutional:      Appearance: Normal appearance. She is not diaphoretic.  HENT:     Head: Normocephalic. No raccoon eyes or Battle's sign.      Right Ear: External ear normal.     Left Ear: External ear normal.     Nose: Nose normal.  Eyes:     Conjunctiva/sclera: Conjunctivae normal.     Right eye: Right conjunctiva is not injected. No chemosis.    Left eye: Left conjunctiva is not injected. No chemosis. Cardiovascular:     Rate and Rhythm: Normal rate and regular rhythm.     Pulses: Normal pulses.     Heart sounds: Normal heart sounds.  Pulmonary:     Effort: Pulmonary effort is normal. No respiratory distress.     Breath sounds: Normal breath sounds. No stridor. No wheezing.  Abdominal:     General: Abdomen is flat. Bowel sounds are normal.     Palpations: Abdomen is soft.     Tenderness: There is no abdominal tenderness.  Musculoskeletal:     Right lower leg: No edema.     Left lower leg: No edema.  Skin:    General:  Skin is warm and dry.  Neurological:     General: No focal deficit present.     Mental Status: She is alert and oriented to person, place, and time.     Deep Tendon Reflexes: Reflexes normal.  Psychiatric:  Mood and Affect: Mood normal.        Behavior: Behavior normal.        Thought Content: Thought content normal.    ED Results / Procedures / Treatments   Labs (all labs ordered are listed, but only abnormal results are displayed) Results for orders placed or performed during the hospital encounter of 01/31/21  CBC with Differential/Platelet  Result Value Ref Range   WBC 14.2 (H) 4.0 - 10.5 K/uL   RBC 5.05 3.87 - 5.11 MIL/uL   Hemoglobin 14.5 12.0 - 15.0 g/dL   HCT 67.1 24.5 - 80.9 %   MCV 86.3 80.0 - 100.0 fL   MCH 28.7 26.0 - 34.0 pg   MCHC 33.3 30.0 - 36.0 g/dL   RDW 98.3 38.2 - 50.5 %   Platelets 344 150 - 400 K/uL   nRBC 0.0 0.0 - 0.2 %   Neutrophils Relative % 68 %   Neutro Abs 9.7 (H) 1.7 - 7.7 K/uL   Lymphocytes Relative 19 %   Lymphs Abs 2.6 0.7 - 4.0 K/uL   Monocytes Relative 6 %   Monocytes Absolute 0.9 0.1 - 1.0 K/uL   Eosinophils Relative 6 %   Eosinophils Absolute 0.8 (H) 0.0 - 0.5 K/uL   Basophils Relative 1 %   Basophils Absolute 0.1 0.0 - 0.1 K/uL   Immature Granulocytes 0 %   Abs Immature Granulocytes 0.04 0.00 - 0.07 K/uL  Basic metabolic panel  Result Value Ref Range   Sodium 137 135 - 145 mmol/L   Potassium 3.7 3.5 - 5.1 mmol/L   Chloride 100 98 - 111 mmol/L   CO2 28 22 - 32 mmol/L   Glucose, Bld 97 70 - 99 mg/dL   BUN 14 6 - 20 mg/dL   Creatinine, Ser 3.97 0.44 - 1.00 mg/dL   Calcium 9.5 8.9 - 67.3 mg/dL   GFR, Estimated >41 >93 mL/min   Anion gap 9 5 - 15  hCG, serum, qualitative  Result Value Ref Range   Preg, Serum NEGATIVE NEGATIVE   Radiology CT Head Wo Contrast  Result Date: 01/31/2021 CLINICAL DATA:  Fall, facial trauma, facial laceration EXAM: CT HEAD WITHOUT CONTRAST CT CERVICAL SPINE WITHOUT CONTRAST TECHNIQUE:  Multidetector CT imaging of the head and cervical spine was performed following the standard protocol without intravenous contrast. Multiplanar CT image reconstructions of the cervical spine were also generated. COMPARISON:  None. FINDINGS: CT HEAD FINDINGS Brain: Normal anatomic configuration. No abnormal intra or extra-axial mass lesion or fluid collection. No abnormal mass effect or midline shift. No evidence of acute intracranial hemorrhage or infarct. Ventricular size is normal. Cerebellum unremarkable. Vascular: Unremarkable Skull: Intact Sinuses/Orbits: Paranasal sinuses are clear. Orbits are unremarkable. Other: Mastoid air cells and middle ear cavities are clear. Mild right supraorbital soft tissue swelling CT CERVICAL SPINE FINDINGS Alignment: Normal. Skull base and vertebrae: No acute fracture. No primary bone lesion or focal pathologic process. Soft tissues and spinal canal: No prevertebral fluid or swelling. No visible canal hematoma. Disc levels: Vertebral body heights and intervertebral disc heights are preserved. The prevertebral soft tissues are not thickened on sagittal reformats. No significant canal stenosis or neuroforaminal narrowing. No significant arthropathy. Upper chest: Unremarkable Other: None IMPRESSION: Right supraorbital soft tissue swelling. No acute intracranial injury. No calvarial fracture. No acute fracture or listhesis of the cervical spine. Electronically Signed   By: Helyn Numbers M.D.   On: 01/31/2021 03:43   CT Cervical Spine Wo Contrast  Result Date: 01/31/2021 CLINICAL  DATA:  Fall, facial trauma, facial laceration EXAM: CT HEAD WITHOUT CONTRAST CT CERVICAL SPINE WITHOUT CONTRAST TECHNIQUE: Multidetector CT imaging of the head and cervical spine was performed following the standard protocol without intravenous contrast. Multiplanar CT image reconstructions of the cervical spine were also generated. COMPARISON:  None. FINDINGS: CT HEAD FINDINGS Brain: Normal anatomic  configuration. No abnormal intra or extra-axial mass lesion or fluid collection. No abnormal mass effect or midline shift. No evidence of acute intracranial hemorrhage or infarct. Ventricular size is normal. Cerebellum unremarkable. Vascular: Unremarkable Skull: Intact Sinuses/Orbits: Paranasal sinuses are clear. Orbits are unremarkable. Other: Mastoid air cells and middle ear cavities are clear. Mild right supraorbital soft tissue swelling CT CERVICAL SPINE FINDINGS Alignment: Normal. Skull base and vertebrae: No acute fracture. No primary bone lesion or focal pathologic process. Soft tissues and spinal canal: No prevertebral fluid or swelling. No visible canal hematoma. Disc levels: Vertebral body heights and intervertebral disc heights are preserved. The prevertebral soft tissues are not thickened on sagittal reformats. No significant canal stenosis or neuroforaminal narrowing. No significant arthropathy. Upper chest: Unremarkable Other: None IMPRESSION: Right supraorbital soft tissue swelling. No acute intracranial injury. No calvarial fracture. No acute fracture or listhesis of the cervical spine. Electronically Signed   By: Helyn Numbers M.D.   On: 01/31/2021 03:43    Procedures .Marland KitchenLaceration Repair  Date/Time: 01/31/2021 5:17 AM Performed by: Cy Blamer, MD Authorized by: Cy Blamer, MD   Universal protocol:    Patient identity confirmed:  Arm band Anesthesia:    Anesthesia method:  Local infiltration   Local anesthetic:  Lidocaine 1% WITH epi (3 ml) Laceration details:    Location:  Face   Face location:  R eyebrow   Length (cm):  1   Depth (mm):  0.5 Pre-procedure details:    Preparation:  Patient was prepped and draped in usual sterile fashion Exploration:    Limited defect created (wound extended): no     Hemostasis achieved with:  Direct pressure   Imaging outcome: foreign body not noted     Wound exploration: entire depth of wound visualized     Wound extent: no areolar  tissue violation noted, no fascia violation noted, no foreign bodies/material noted, no muscle damage noted, no nerve damage noted, no tendon damage noted, no underlying fracture noted and no vascular damage noted     Contaminated: no   Treatment:    Area cleansed with:  Povidone-iodine, chlorhexidine and saline   Amount of cleaning:  Extensive   Irrigation solution:  Sterile saline   Irrigation method:  Syringe   Debridement:  None   Undermining:  None   Scar revision: no   Skin repair:    Repair method:  Sutures   Suture size:  6-0   Wound skin closure material used: vicryl rapide.   Suture technique:  Simple interrupted   Number of sutures:  3 Approximation:    Approximation:  Close Post-procedure details:    Dressing:  Sterile dressing   Procedure completion:  Tolerated well, no immediate complications Comments:     Sterile gloves worn for procedure    Medications Ordered in ED Medications  lidocaine-EPINEPHrine (XYLOCAINE W/EPI) 2 %-1:200000 (PF) injection (  Given by Other 01/31/21 0405)  ibuprofen (ADVIL) tablet 800 mg (800 mg Oral Given 01/31/21 0352)  sodium chloride 0.9 % bolus 1,000 mL (0 mLs Intravenous Stopped 01/31/21 0449)    ED Course  I have reviewed the triage vital signs and the nursing notes.  Pertinent labs & imaging results that were available during my care of the patient were reviewed by me and considered in my medical decision making (see chart for details).  CT scan of head and cervical spine ordered in addition to labs and orthostatic vital signs.  Patient was taken immediately following exam to CT scan.    Upon return from CT scan orthostatic vitals signs measured:  Orthostatic VS for the past 24 hrs:  BP- Lying Pulse- Lying BP- Sitting Pulse- Sitting BP- Standing at 0 minutes Pulse- Standing at 0 minutes  01/31/21 0329 111/78 82 105/70 72 101/70 93    Patient was found to have orthostatic hypotension and IV was established to obtain lab work. 1  Liter normal saline bolus was ordered, along with ibuprofen for patient's pain.   Patient's wound was cleansed with sterile saline and iodine.     Patient was updated on the results of her her CT scan of the head and cervical spine.    Suturing performed at bedside without complications. Absorbable sutures placed as the area will not have much movement and the patient will not have to return for removal.    Sterile dressing applied to laceration.  Laceration instructions given verbally, no submersion of the wound in any body of water: ocean, lake, pool, hot tub, bath tub, river for 2 weeks.    Patient updated on results of both CBC and basic metabolic profile.    IVF fluids continued and patient was given an oral trial of liquids with apple juice.   Patient is stable for discharge with close follow up.      Final Clinical Impression(s) / ED Diagnoses Final diagnoses:  Facial laceration, initial encounter  Minor head injury, initial encounter  Syncope due to orthostatic hypotension   Written instructions given to hold spironolactone for 48 hours in order to allow the patient time to orally rehydrate to prevent further orthostatic hypotension.  Patient is also instructed to increase liquid intake to 6-12# 8 ounce glasses of water, juice or sports drinks.   Do not submerge the wound in any body for water for 2 weeks. The patient may shower in 12 hours but no lotions nor cosmetics applied to the face for 72 hours.  Wound care instructions provided on AVS,   Patient should return immediately for any new or concerning symptoms.    Rx / DC Orders ED Discharge Orders          Ordered    naproxen (NAPROSYN) 500 MG tablet  2 times daily with meals        01/31/21 0418             Eulogia Dismore, MD 01/31/21 6834

## 2021-01-31 NOTE — ED Triage Notes (Signed)
Patient from home states she arose from bed to check on toddler and she fainted. Patient hit face and has lac above right eye. Patient reports taking aldactone for acne.

## 2022-06-19 ENCOUNTER — Other Ambulatory Visit (HOSPITAL_COMMUNITY): Payer: Self-pay

## 2022-06-19 MED ORDER — LISDEXAMFETAMINE DIMESYLATE 40 MG PO CAPS
ORAL_CAPSULE | ORAL | 0 refills | Status: AC
  Filled 2022-06-19: qty 30, 30d supply, fill #0

## 2022-06-21 ENCOUNTER — Other Ambulatory Visit (HOSPITAL_COMMUNITY): Payer: Self-pay

## 2022-07-18 ENCOUNTER — Other Ambulatory Visit (HOSPITAL_BASED_OUTPATIENT_CLINIC_OR_DEPARTMENT_OTHER): Payer: Self-pay

## 2022-07-18 ENCOUNTER — Other Ambulatory Visit: Payer: Self-pay

## 2022-07-18 ENCOUNTER — Emergency Department (HOSPITAL_BASED_OUTPATIENT_CLINIC_OR_DEPARTMENT_OTHER): Payer: BC Managed Care – PPO

## 2022-07-18 ENCOUNTER — Encounter (HOSPITAL_BASED_OUTPATIENT_CLINIC_OR_DEPARTMENT_OTHER): Payer: Self-pay | Admitting: Emergency Medicine

## 2022-07-18 ENCOUNTER — Emergency Department (HOSPITAL_BASED_OUTPATIENT_CLINIC_OR_DEPARTMENT_OTHER)
Admission: EM | Admit: 2022-07-18 | Discharge: 2022-07-18 | Disposition: A | Discharge status: Home / Self Care | Payer: BC Managed Care – PPO | Attending: Emergency Medicine | Admitting: Emergency Medicine

## 2022-07-18 DIAGNOSIS — R1030 Lower abdominal pain, unspecified: Secondary | ICD-10-CM | POA: Diagnosis present

## 2022-07-18 DIAGNOSIS — N134 Hydroureter: Secondary | ICD-10-CM | POA: Diagnosis not present

## 2022-07-18 LAB — URINALYSIS, ROUTINE W REFLEX MICROSCOPIC
Bacteria, UA: NONE SEEN
Bilirubin Urine: NEGATIVE
Glucose, UA: NEGATIVE mg/dL
Leukocytes,Ua: NEGATIVE
Nitrite: NEGATIVE
Protein, ur: NEGATIVE mg/dL
Specific Gravity, Urine: 1.01 (ref 1.005–1.030)
pH: 6.5 (ref 5.0–8.0)

## 2022-07-18 LAB — COMPREHENSIVE METABOLIC PANEL
ALT: 10 U/L (ref 0–44)
AST: 18 U/L (ref 15–41)
Albumin: 4.5 g/dL (ref 3.5–5.0)
Alkaline Phosphatase: 44 U/L (ref 38–126)
Anion gap: 8 (ref 5–15)
BUN: 8 mg/dL (ref 6–20)
CO2: 26 mmol/L (ref 22–32)
Calcium: 8.9 mg/dL (ref 8.9–10.3)
Chloride: 102 mmol/L (ref 98–111)
Creatinine, Ser: 0.93 mg/dL (ref 0.44–1.00)
GFR, Estimated: >60 mL/min (ref >60)
Glucose, Bld: 99 mg/dL (ref 70–99)
Potassium: 3.5 mmol/L (ref 3.5–5.1)
Sodium: 136 mmol/L (ref 135–145)
Total Bilirubin: 0.4 mg/dL (ref 0.3–1.2)
Total Protein: 7.4 g/dL (ref 6.5–8.1)

## 2022-07-18 LAB — CBC WITH DIFFERENTIAL/PLATELET
Abs Immature Granulocytes: 0.02 10*3/uL (ref 0.00–0.07)
Basophils Absolute: 0.1 10*3/uL (ref 0.0–0.1)
Basophils Relative: 1 %
Eosinophils Absolute: 0.2 10*3/uL (ref 0.0–0.5)
Eosinophils Relative: 3 %
HCT: 42.7 % (ref 36.0–46.0)
Hemoglobin: 14.4 g/dL (ref 12.0–15.0)
Immature Granulocytes: 0 %
Lymphocytes Relative: 15 %
Lymphs Abs: 1 10*3/uL (ref 0.7–4.0)
MCH: 29.2 pg (ref 26.0–34.0)
MCHC: 33.7 g/dL (ref 30.0–36.0)
MCV: 86.6 fL (ref 80.0–100.0)
Monocytes Absolute: 0.4 10*3/uL (ref 0.1–1.0)
Monocytes Relative: 5 %
Neutro Abs: 5 10*3/uL (ref 1.7–7.7)
Neutrophils Relative %: 76 %
Platelets: 305 10*3/uL (ref 150–400)
RBC: 4.93 MIL/uL (ref 3.87–5.11)
RDW: 13.7 % (ref 11.5–15.5)
WBC: 6.6 10*3/uL (ref 4.0–10.5)
nRBC: 0 % (ref 0.0–0.2)

## 2022-07-18 LAB — PREGNANCY, URINE: Preg Test, Ur: NEGATIVE

## 2022-07-18 LAB — LIPASE, BLOOD: Lipase: 13 U/L (ref 11–51)

## 2022-07-18 MED ORDER — DICYCLOMINE HCL 10 MG PO CAPS
10.0000 mg | ORAL_CAPSULE | Freq: Once | ORAL | Status: AC
  Administered 2022-07-18: 10 mg via ORAL
  Filled 2022-07-18: qty 1

## 2022-07-18 MED ORDER — SODIUM CHLORIDE 0.9 % IV BOLUS
1000.0000 mL | Freq: Once | INTRAVENOUS | Status: AC
  Administered 2022-07-18: 1000 mL via INTRAVENOUS

## 2022-07-18 MED ORDER — ONDANSETRON 4 MG PO TBDP
4.0000 mg | ORAL_TABLET | Freq: Three times a day (TID) | ORAL | 0 refills | Status: AC | PRN
  Filled 2022-07-18: qty 12, 4d supply, fill #0

## 2022-07-18 MED ORDER — KETOROLAC TROMETHAMINE 15 MG/ML IJ SOLN
15.0000 mg | Freq: Once | INTRAMUSCULAR | Status: AC
  Administered 2022-07-18: 15 mg via INTRAVENOUS
  Filled 2022-07-18: qty 1

## 2022-07-18 MED ORDER — ONDANSETRON HCL 4 MG/2ML IJ SOLN
4.0000 mg | Freq: Once | INTRAMUSCULAR | Status: AC
  Administered 2022-07-18: 4 mg via INTRAVENOUS
  Filled 2022-07-18: qty 2

## 2022-07-18 MED ORDER — IOHEXOL 300 MG/ML  SOLN
100.0000 mL | Freq: Once | INTRAMUSCULAR | Status: AC | PRN
Start: 2022-07-18 — End: 2022-07-18
  Administered 2022-07-18: 80 mL via INTRAVENOUS

## 2022-07-18 NOTE — ED Notes (Signed)
Discharge paperwork given and verbally understood. 

## 2022-07-18 NOTE — ED Notes (Signed)
RT placed IV in 1 stick 

## 2022-07-18 NOTE — ED Provider Notes (Signed)
[facility] Provider Note   CSN: [id number] Arrival date & time: 07/18/22  [id number]     History  Chief Complaint  Patient presents with   Abdominal Pain    Donna Harrison is a 37 y.o. female.   Abdominal Pain   37 year old female presents emergency department with complaints of abdominal pain, nausea.  Patient states that symptoms began on Wednesday when riding a roller coaster with one of her children.  Reported onset of nausea which has been present since.  States that she developed lower abdominal cramping type sensation thereafter.  Reports decreased p.o. intake secondary to nausea and lower abdominal pain.  Denies history of similar abdominal pain in the past.  Prior abdominal surgeries include appendectomy.  Reports 1 episode of emesis last night.  Denies fever, chills, chest pain, shortness of breath, hematemesis, urinary/vaginal symptoms, change in bowel habits.  Has been taking at home Zofran which has helped some with feelings of nausea.  Patient states that she is currently on oral contraceptives with last menstrual period ending 10 days to 2 weeks ago.  Past medical history significant for PCOS, anxiety, allergy, ADHD  Home Medications Prior to Admission medications   Medication Sig Start Date End Date Taking? Authorizing Provider  ondansetron (ZOFRAN-ODT) 4 MG disintegrating tablet Dissolve 1 tablet under the tongue every 8 (eight) hours as needed for nausea or vomiting. 07/18/22  Yes Dion Saucier A, PA  FLUoxetine (PROZAC) 40 MG capsule Take 1 capsule (40 mg total) by mouth daily. 08/10/18   Brunetta Jeans, PA-C  lisdexamfetamine (VYVANSE) 40 MG capsule Take 1 capsule (40 mg total) by mouth every morning. 06/19/22     naproxen (NAPROSYN) 500 MG tablet Take 1 tablet (500 mg total) by mouth 2 (two) times daily with a meal. 01/31/21   Palumbo, April, MD      Allergies    Pollen extract    Review of Systems   Review of  Systems  Gastrointestinal:  Positive for abdominal pain.  All other systems reviewed and are negative.   Physical Exam Updated Vital Signs BP 99/71 (BP Location: Right Arm)   Pulse 63   Temp 97.8 F (36.6 C) (Oral)   Resp 16   Ht 5\' 6"  (1.676 m)   Wt 61.2 kg   LMP 07/04/2022   SpO2 100%   BMI 21.79 kg/m  Physical Exam Vitals and nursing note reviewed.  Constitutional:      General: She is not in acute distress.    Appearance: She is well-developed.  HENT:     Head: Normocephalic and atraumatic.  Eyes:     Conjunctiva/sclera: Conjunctivae normal.  Cardiovascular:     Rate and Rhythm: Normal rate and regular rhythm.     Heart sounds: No murmur heard. Pulmonary:     Effort: Pulmonary effort is normal. No respiratory distress.     Breath sounds: Normal breath sounds.  Abdominal:     Palpations: Abdomen is soft.     Tenderness: There is abdominal tenderness in the suprapubic area and left lower quadrant. There is no right CVA tenderness or left CVA tenderness. Negative signs include Murphy's sign and McBurney's sign.  Musculoskeletal:        General: No swelling.     Cervical back: Neck supple.  Skin:    General: Skin is warm and dry.     Capillary Refill: Capillary refill takes less than 2 seconds.  Neurological:     Mental Status:  She is alert.  Psychiatric:        Mood and Affect: Mood normal.     ED Results / Procedures / Treatments   Labs (all labs ordered are listed, but only abnormal results are displayed) Labs Reviewed  URINALYSIS, ROUTINE W REFLEX MICROSCOPIC - Abnormal; Notable for the following components:      Result Value   Hgb urine dipstick TRACE (*)    Ketones, ur TRACE (*)    All other components within normal limits  COMPREHENSIVE METABOLIC PANEL  LIPASE, BLOOD  CBC WITH DIFFERENTIAL/PLATELET  PREGNANCY, URINE    EKG None  Radiology US PELVIC COMPLETE W TRANSVAGINAL AND TORSION R/O  Result Date: 07/18/2022 CLINICAL DATA:  Left lower  quadrant abdominal pain and nausea EXAM: TRANSABDOMINAL AND TRANSVAGINAL ULTRASOUND OF PELVIS DOPPLER ULTRASOUND OF OVARIES TECHNIQUE: Both transabdominal and transvaginal ultrasound examinations of the pelvis were performed. Transabdominal technique was performed for global imaging of the pelvis including uterus, ovaries, adnexal regions, and pelvic cul-de-sac. It was necessary to proceed with endovaginal exam following the transabdominal exam to visualize the endometrium and ovaries. Color and duplex Doppler ultrasound was utilized to evaluate blood flow to the ovaries. COMPARISON:  CT earlier 07/18/2022 FINDINGS: Uterus Measurements: 8.1 x 3.8 x 4.1 = volume: 66 mL. No fibroids or other mass visualized. Endometrium Thickness: 3 mm.  No focal abnormality visualized. Right ovary Measurements: 2.4 x 1.8 x 1.3 = volume: 2.9 mL. Normal appearance/no adnexal mass. Left ovary Measurements: 2.3 x 1.9 x 1.3 = volume: 3.0 mL. Normal appearance/no adnexal mass. Pulsed Doppler evaluation of both ovaries demonstrates normal low-resistance arterial and venous waveforms. Other findings Trace free fluid in the pelvis with some bowel. IMPRESSION: Unremarkable pelvic ultrasound.  Trace free fluid Electronically Signed   By: Jill Side M.D.   On: 07/18/2022 13:37   CT Abdomen Pelvis W Contrast  Result Date: 07/18/2022 CLINICAL DATA:  Left lower quadrant abdominal pain EXAM: CT ABDOMEN AND PELVIS WITH CONTRAST TECHNIQUE: Multidetector CT imaging of the abdomen and pelvis was performed using the standard protocol following bolus administration of intravenous contrast. RADIATION DOSE REDUCTION: This exam was performed according to the departmental dose-optimization program which includes automated exposure control, adjustment of the mA and/or kV according to patient size and/or use of iterative reconstruction technique. CONTRAST:  43mL OMNIPAQUE IOHEXOL 300 MG/ML  SOLN COMPARISON:  None Available. FINDINGS: Lower chest: No acute  abnormality. Hepatobiliary: No solid liver abnormality is seen. Two hypodense lesions with peripheral nodular contrast enhancement in the right lobe of the liver, hepatic segment VII and VI, consistent with benign hepatic hemangiomata for which no further follow-up or characterization is required (series 2, image 17, 28). No gallstones, gallbladder wall thickening, or biliary dilatation. Pancreas: Unremarkable. No pancreatic ductal dilatation or surrounding inflammatory changes. Spleen: Normal in size without significant abnormality. Adrenals/Urinary Tract: Adrenal glands are unremarkable. Mild right hydronephrosis with minimal hydroureter. No calculus or other obstructing etiology identified (series 2, image 33). No left-sided hydronephrosis. Bladder is unremarkable. Stomach/Bowel: Stomach is within normal limits. Appendix not clearly visualized. No evidence of bowel wall thickening, distention, or inflammatory changes. Vascular/Lymphatic: No significant vascular findings are present. No enlarged abdominal or pelvic lymph nodes. Reproductive: No mass or other significant abnormality. Other: No abdominal wall hernia or abnormality. Small volume fluid in the low pelvis (series 2, image 70). Musculoskeletal: No acute or significant osseous findings. IMPRESSION: 1. Mild right hydronephrosis with minimal hydroureter. No calculus or other obstructing etiology identified. 2. Small volume fluid in  the low pelvis, nonspecific and possibly physiologic in the reproductive age setting. 3. No specific CT findings of the abdomen or pelvis to explain left lower quadrant pain. Electronically Signed   By: Delanna Ahmadi M.D.   On: 07/18/2022 11:44    Procedures Procedures    Medications Ordered in ED Medications  sodium chloride 0.9 % bolus 1,000 mL (0 mLs Intravenous Stopped 07/18/22 1100)  ondansetron (ZOFRAN) injection 4 mg (4 mg Intravenous Given 07/18/22 1022)  dicyclomine (BENTYL) capsule 10 mg (10 mg Oral Given  07/18/22 1105)  iohexol (OMNIPAQUE) 300 MG/ML solution 100 mL (80 mLs Intravenous Contrast Given 07/18/22 1108)  ketorolac (TORADOL) 15 MG/ML injection 15 mg (15 mg Intravenous Given 07/18/22 1215)    ED Course/ Medical Decision Making/ A&P Clinical Course as of 07/18/22 1354  Fri Jul 18, 2022  1212 Shared decision-making conversation was had with patient regarding further imaging via pelvic ultrasound given CT study findings, she elected for further assessment via pelvic ultrasound.  To reassess afterwards. [CR]    Clinical Course User Index [CR] Wilnette Kales, PA                             Medical Decision Making Amount and/or Complexity of Data Reviewed Labs: ordered. Radiology: ordered.  Risk Prescription drug management.   This patient presents to the ED for concern of abdominal pain, this involves an extensive number of treatment options, and is a complaint that carries with it a high risk of complications and morbidity.  The differential diagnosis includes pancreatitis, gastritis/PUD, cholecystitis, hepatitis, CBD pathology, volvulus, SBO/LBO, diverticulitis, appendicitis, nephrolithiasis, pyelonephritis, ectopic pregnancy, ovarian torsion, cystitis, tubo-ovarian abscess, PID   Co morbidities that complicate the patient evaluation  See HPI   Additional history obtained:  Additional history obtained from EMR External records from outside source obtained and reviewed including hospital records   Lab Tests:  I Ordered, and personally interpreted labs.  The pertinent results include: No leukocytosis noted.  No evidence of anemia.  Platelets within normal range.  No electrolyte abnormalities appreciated.  No transaminitis.  No renal dysfunction.  Urine pregnancy negative.  UA significant for trace ketones, trace hemoglobin but otherwise unremarkable.  Lipase within normal limits.   Imaging Studies ordered:  I ordered imaging studies including CT abdomen pelvis, pelvic  ultrasound I independently visualized and interpreted imaging which showed  CT abdomen pelvis: Mild right-sided hydronephrosis with minimal hydroureter.  No calculus or obstructing pathology.  Small volume fluid in the pelvis. Pelvic ultrasound: Pelvic free fluid.  No acute abnormalities I agree with the radiologist interpretation  Cardiac Monitoring: / EKG:  The patient was maintained on a cardiac monitor.  I personally viewed and interpreted the cardiac monitored which showed an underlying rhythm of: Sinus rhythm   Consultations Obtained:  N/a   Problem List / ED Course / Critical interventions / Medication management  Lower abdominal pain I ordered medication including Bentyl, Zofran, 1 L normal saline   Reevaluation of the patient after these medicines showed that the patient improved I have reviewed the patients home medicines and have made adjustments as needed   Social Determinants of Health:  Denies tobacco, illicit drug use.   Test / Admission - Considered:  Lower abdominal pain Vitals signs within normal range and stable throughout visit. Laboratory/imaging studies significant for: See above 37 year old female presents emergency department with complaints of lower abdominal pain/nausea.  Patient noted near resolution of symptoms with  administration of medicines while in the emergency department.  Tolerating p.o. with improvement of abdominal discomfort.  Workup today overall reassuring.  CT imaging showed mild right-sided hydronephrosis with mild right hydroureter but no other acute abnormalities on CT abdomen pelvis or ultrasound of pelvis.  Patient overall well-appearing, afebrile, without leukocytosis, no acute distress.  Recommend Zofran as needed for feelings of nausea as well as begin with bland diet and adding more complex foods as tolerable.  Follow-up with primary care recommended for reevaluation of symptoms.  Treatment plan discussed at length with patient and  she acknowledged understanding was agreeable to said plan. Worrisome signs and symptoms were discussed with the patient, and the patient acknowledged understanding to return to the ED if noticed. Patient was stable upon discharge.          Final Clinical Impression(s) / ED Diagnoses Final diagnoses:  Lower abdominal pain  Hydroureter, right    Rx / DC Orders ED Discharge Orders          Ordered    ondansetron (ZOFRAN-ODT) 4 MG disintegrating tablet  Every 8 hours PRN        07/18/22 1343              Wilnette Kales, [place] 07/18/22 1354    Dorie Rank, MD 07/21/22 [phone]

## 2022-07-18 NOTE — ED Triage Notes (Signed)
Pt arrives pov, steady gait with c/o RLQ pain and nausea x 2 days with decreased appetite. Pt reports symptoms [place]. No relief with otc meds and zofran

## 2022-07-18 NOTE — Discharge Instructions (Addendum)
Workup today overall reassuring.  As discussed, you do have some mild swelling of left-sided ureter/kidney due to care increased fluid with no evidence of kidney stone.  Otherwise, CT imaging of your abdomen/pelvis, pelvic ultrasound and laboratory studies reassuring.  Attached is number for urology to call to set an appointment for reevaluation.  Recommend Tylenol/Motrin as needed for pain/discomfort in the meantime.  I will prescribe Zofran to take as needed for nausea/vomiting.  Please do not hesitate to return to emergency department if the worrisome signs and symptoms we discussed become apparent.

## 2022-07-28 ENCOUNTER — Other Ambulatory Visit (HOSPITAL_BASED_OUTPATIENT_CLINIC_OR_DEPARTMENT_OTHER): Payer: Self-pay

## 2023-02-22 IMAGING — CT CT HEAD W/O CM
4 series · 16 of 47 positions shown, 18 images · non-contrast
Comparison: None.

CLINICAL DATA: Fall, facial trauma, facial laceration

EXAM:
CT HEAD WITHOUT CONTRAST
CT CERVICAL SPINE WITHOUT CONTRAST
TECHNIQUE: Multidetector CT imaging of the head and cervical spine was
performed following the standard protocol without intravenous
contrast. Multiplanar CT image reconstructions of the cervical spine
were also generated.

[Series 2: head wo · axial · 0.41mm/px · z∈[+951,+1066]mm · 7 of 31 slices shown, 9 images]
[im 4/31  brain]
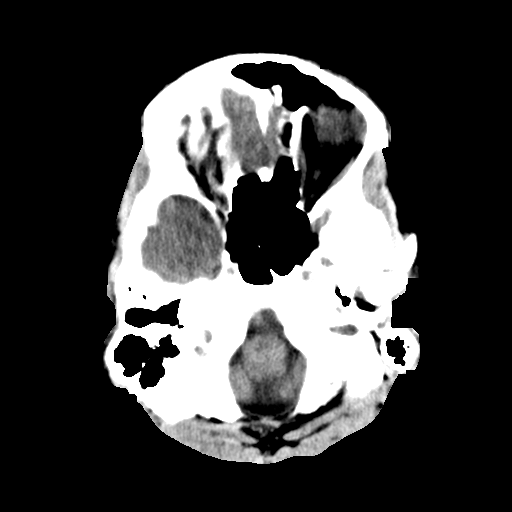
[im 4/31  bone]
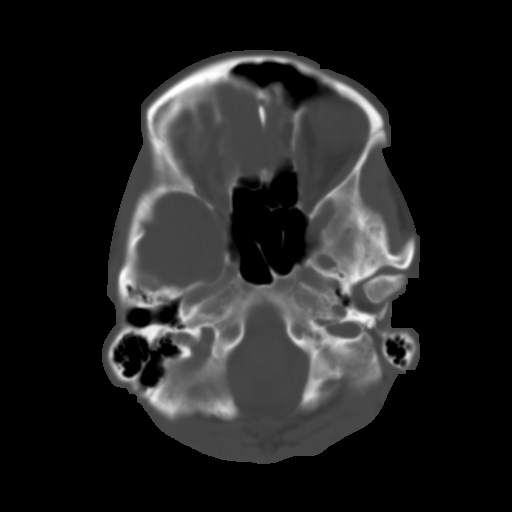
[im 8/31  brain]
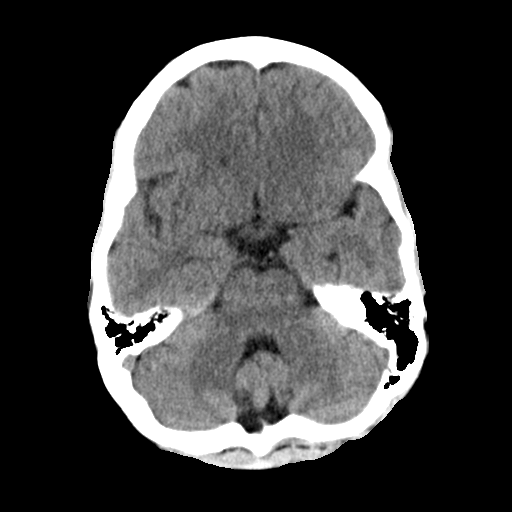
[im 12/31  brain]
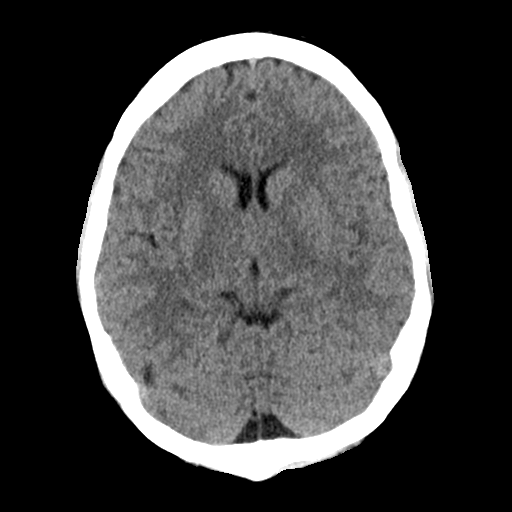
[im 16/31  brain]
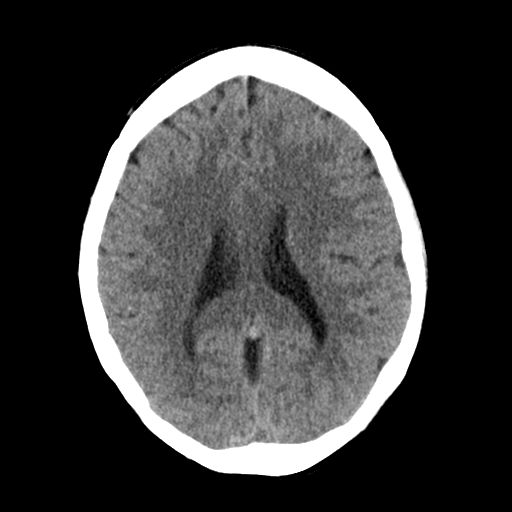
[im 19/31  brain]
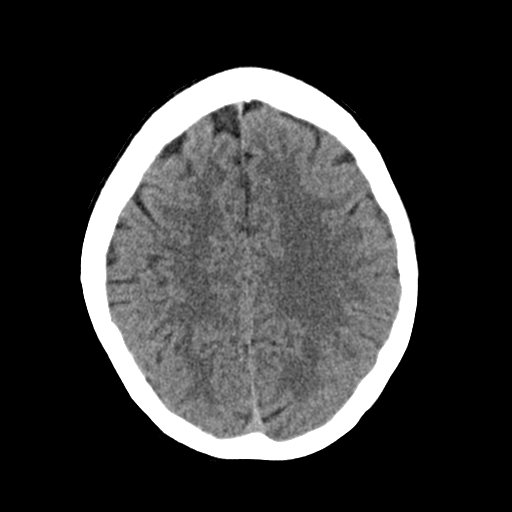
[im 19/31  bone]
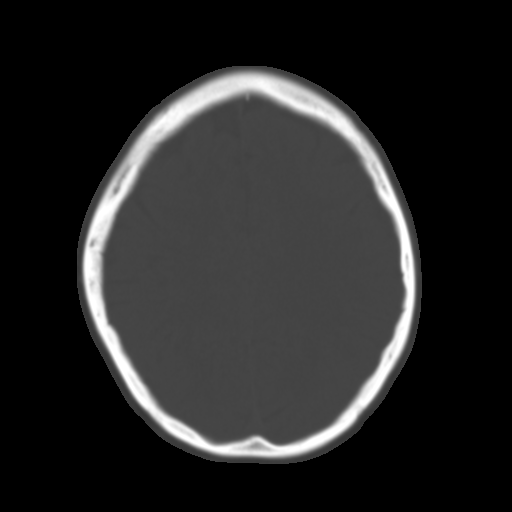
[im 23/31  brain]
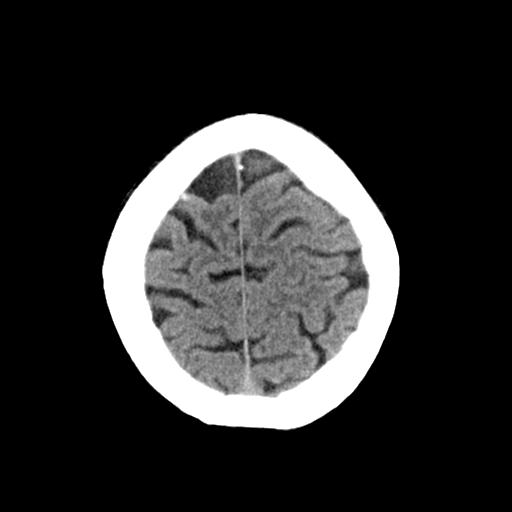
[im 27/31  brain]
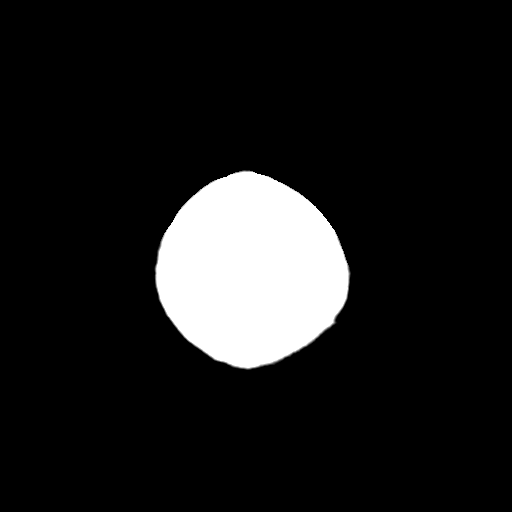

[Series 3: head bone · axial · 0.41mm/px · z∈[+950,+980]mm · 3 of 76 slices shown]
[im 8/76  bone]
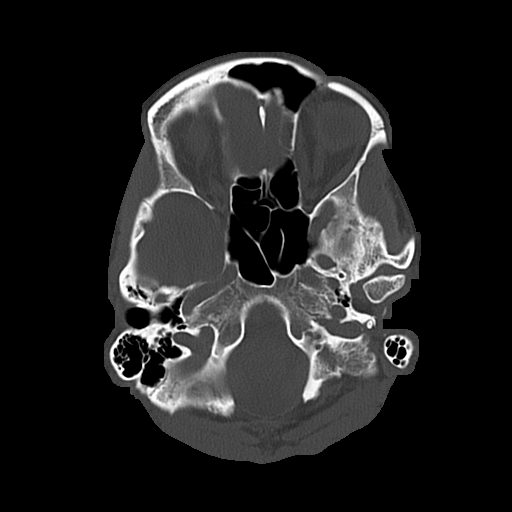
[im 16/76  bone]
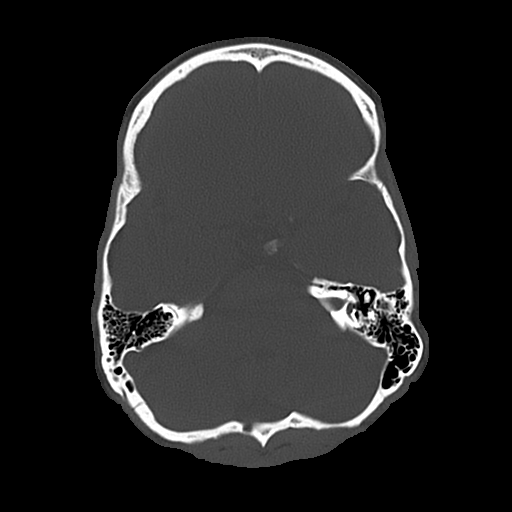
[im 23/76  bone]
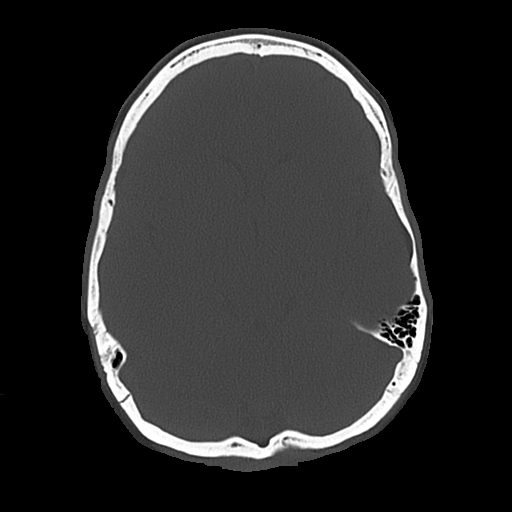

[Series 4: coronal soft · coronal · 0.33mm/px · 3 of 62 slices shown]
[im 21/62  brain]
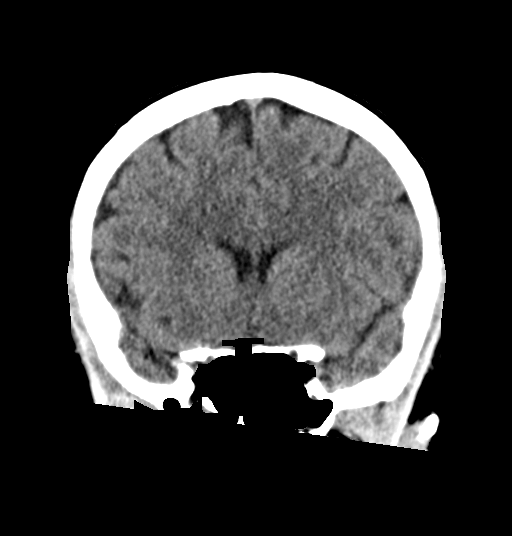
[im 28/62  brain]
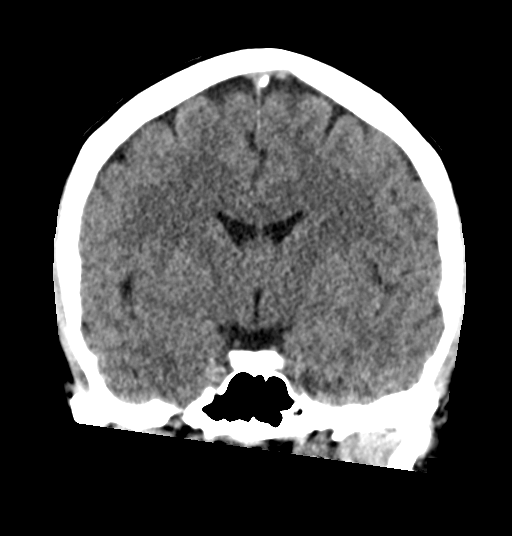
[im 34/62  brain]
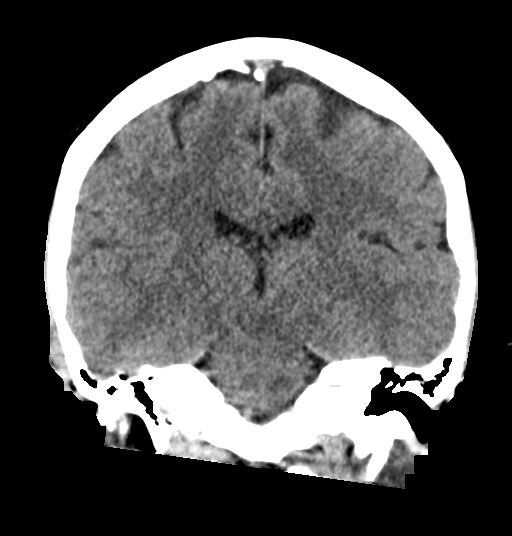

[Series 5: sagittal soft · sagittal · 0.35mm/px · 3 of 53 slices shown]
[im 19/53  brain]
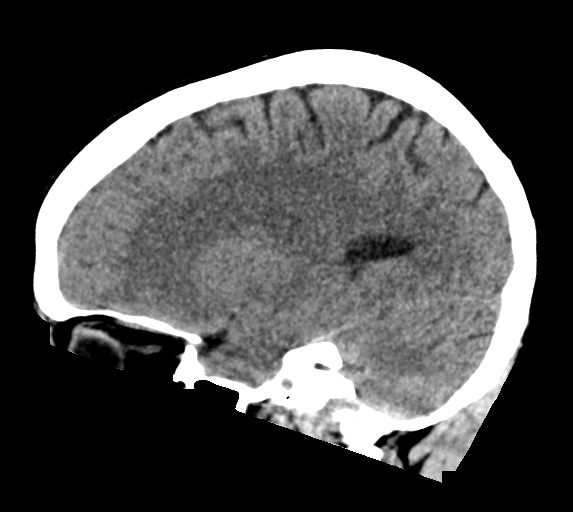
[im 27/53  brain]
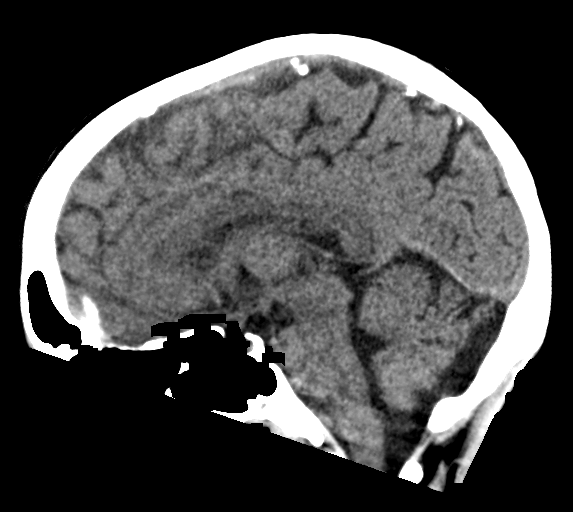
[im 35/53  brain]
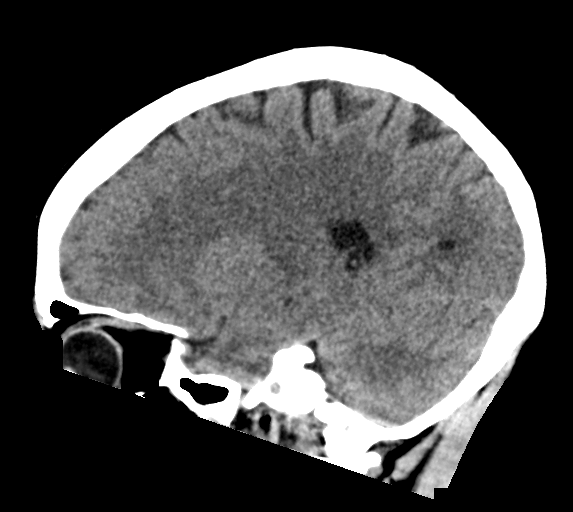

[16 of 47 positions shown; findings below may reference images not displayed]

FINDINGS: CT HEAD FINDINGS

Brain: Normal anatomic configuration. No abnormal intra or
extra-axial mass lesion or fluid collection. No abnormal mass effect
or midline shift. No evidence of acute intracranial hemorrhage or
infarct. Ventricular size is normal. Cerebellum unremarkable.

Vascular: Unremarkable

Skull: Intact

Sinuses/Orbits: Paranasal sinuses are clear. Orbits are
unremarkable.

Other: Mastoid air cells and middle ear cavities are clear. Mild
right supraorbital soft tissue swelling

CT CERVICAL SPINE FINDINGS

Alignment: Normal.

Skull base and vertebrae: No acute fracture. No primary bone lesion
or focal pathologic process.

Soft tissues and spinal canal: No prevertebral fluid or swelling. No
visible canal hematoma.

Disc levels: Vertebral body heights and intervertebral disc heights
are preserved. The prevertebral soft tissues are not thickened on
sagittal reformats. No significant canal stenosis or neuroforaminal
narrowing. No significant arthropathy.

Upper chest: Unremarkable

Other: None
IMPRESSION: Right supraorbital soft tissue swelling. No acute intracranial
injury. No calvarial fracture.

No acute fracture or listhesis of the cervical spine.

## 2023-02-22 IMAGING — CT CT CERVICAL SPINE W/O CM
3 of 5 series · 10 of 33 positions shown, 12 images · non-contrast
Comparison: None.

CLINICAL DATA: Fall, facial trauma, facial laceration

EXAM:
CT HEAD WITHOUT CONTRAST
CT CERVICAL SPINE WITHOUT CONTRAST
TECHNIQUE: Multidetector CT imaging of the head and cervical spine was
performed following the standard protocol without intravenous
contrast. Multiplanar CT image reconstructions of the cervical spine
were also generated.

[Series 5: cor bone · coronal · 0.23mm/px · 3 of 44 slices shown]
[im 9/44  bone]
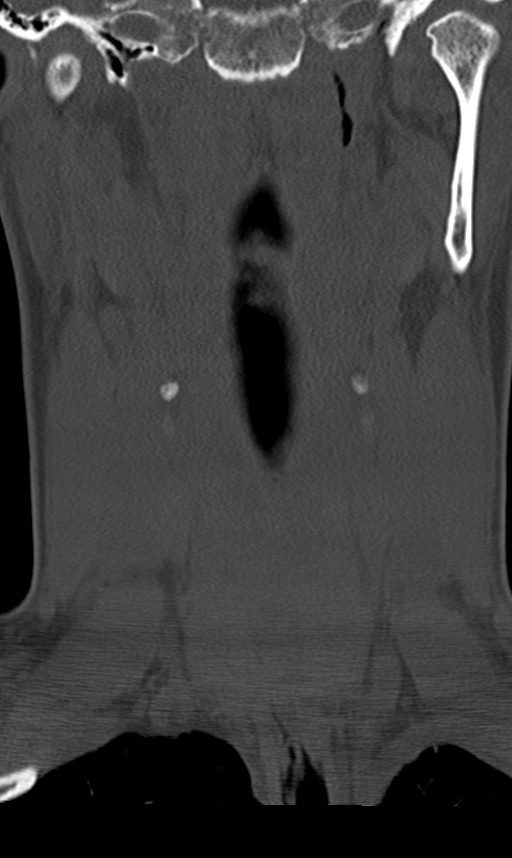
[im 18/44  bone]
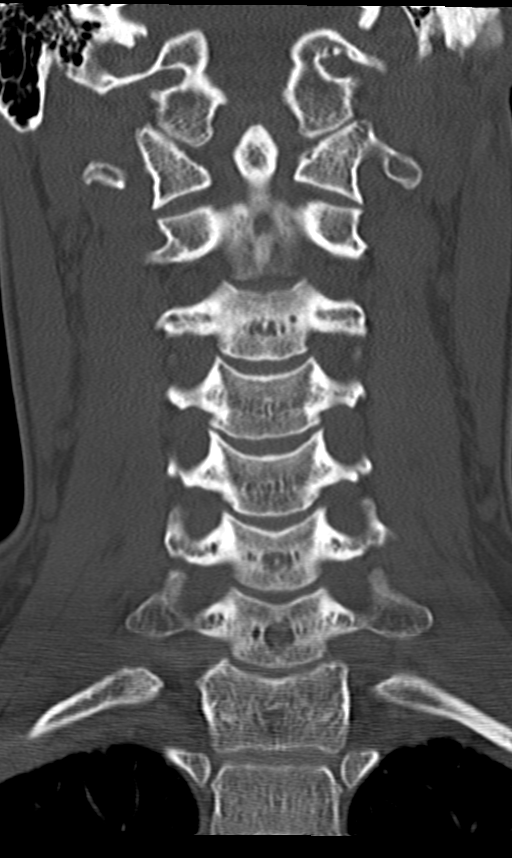
[im 26/44  bone]
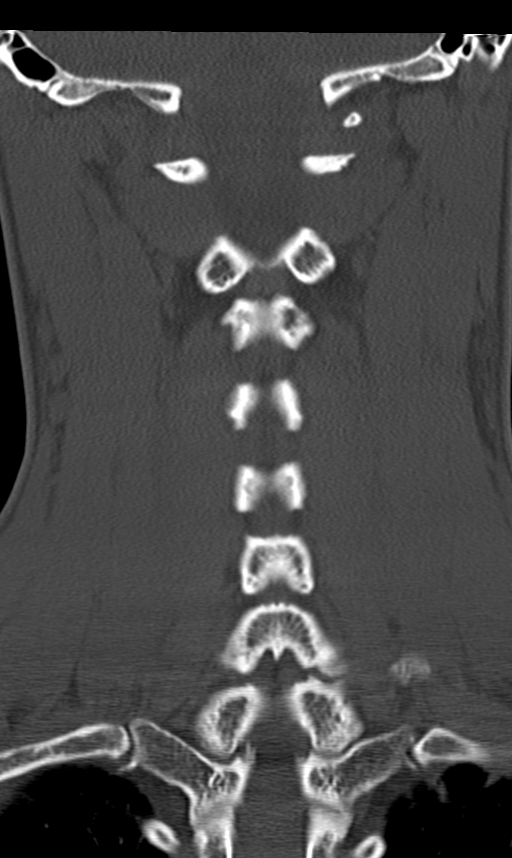

[Series 6: sag bone · sagittal · 0.20mm/px · 5 of 49 slices shown, 6 images]
[im 17/49  bone]
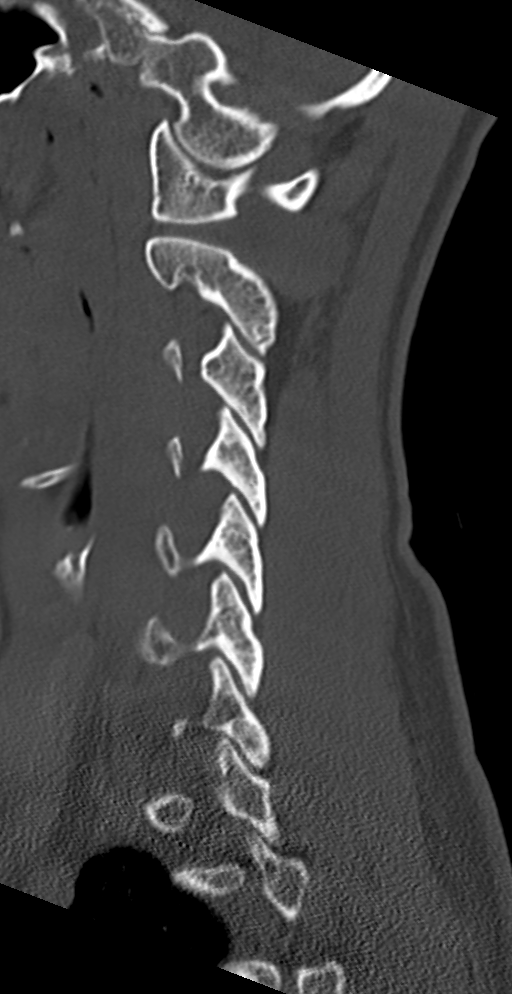
[im 21/49  bone]
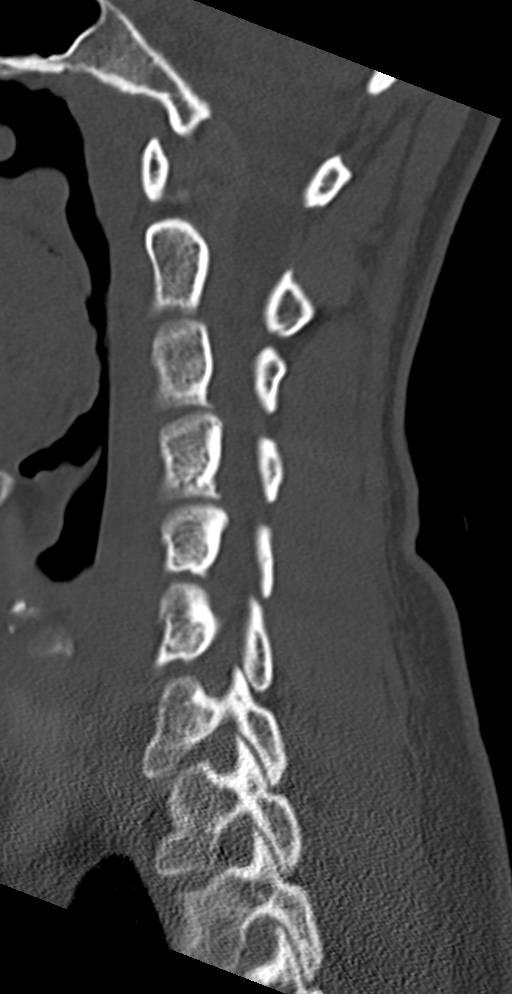
[im 25/49  soft-tissue]
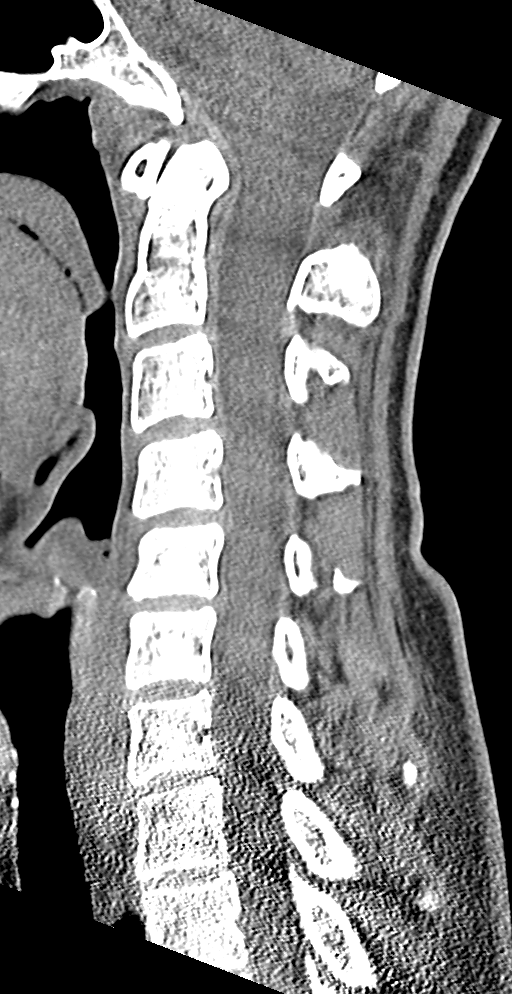
[im 25/49  bone]
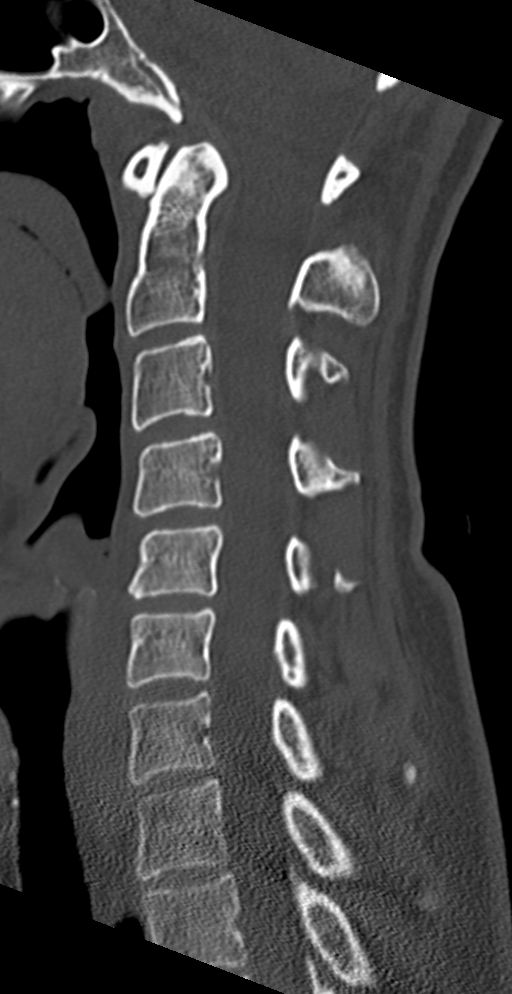
[im 29/49  bone]
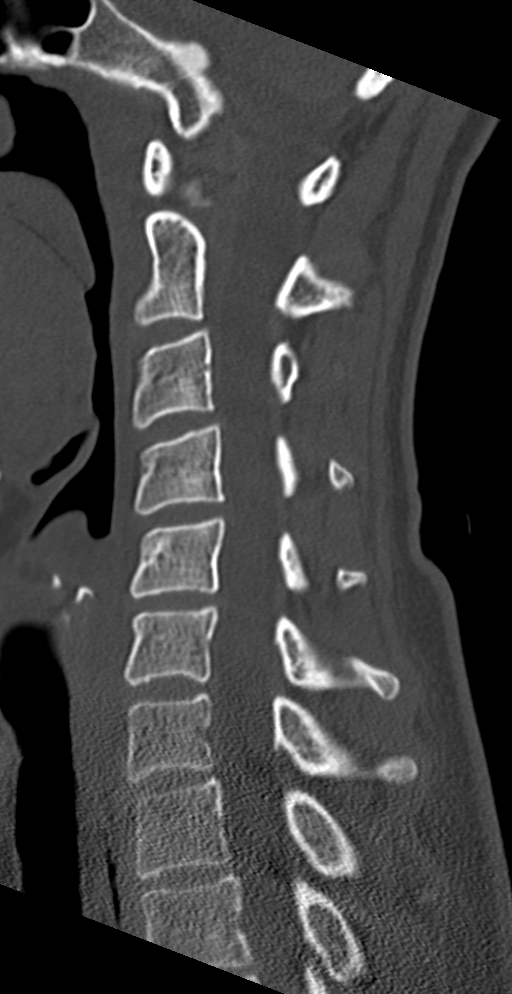
[im 33/49  bone]
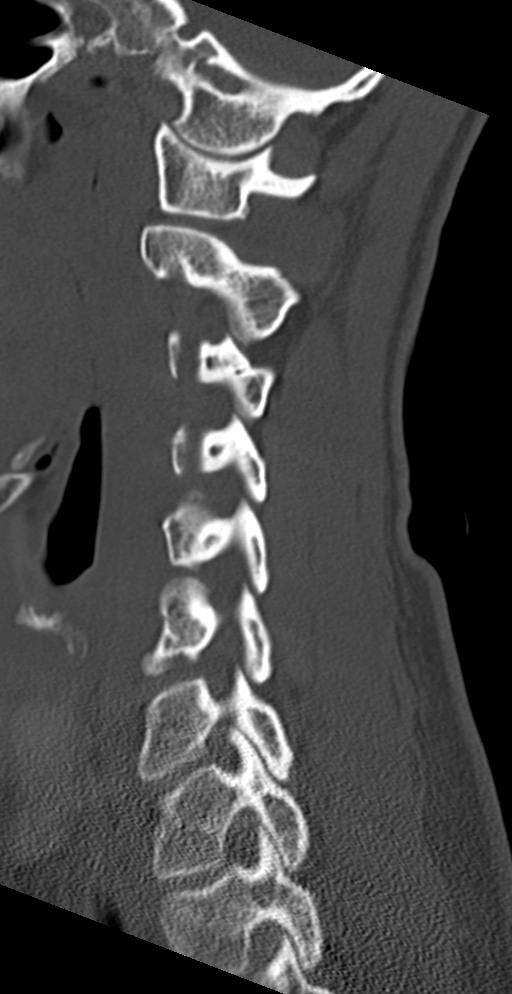

[Series 7: orthogonal axials · axial · 0.21mm/px · z∈[+816,+894]mm · 2 of 91 slices shown, 3 images]
[im 23/91  soft-tissue]
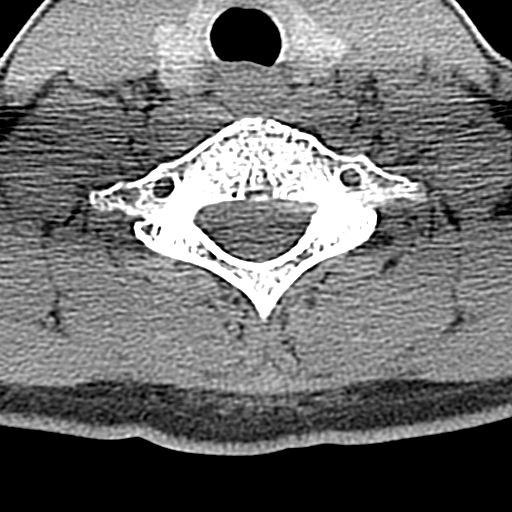
[im 23/91  bone]
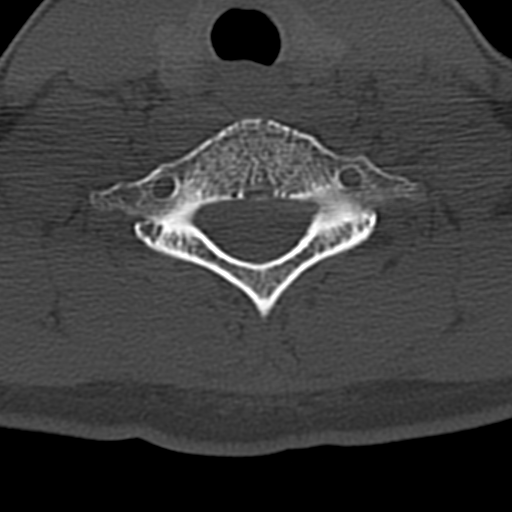
[im 68/91  bone]
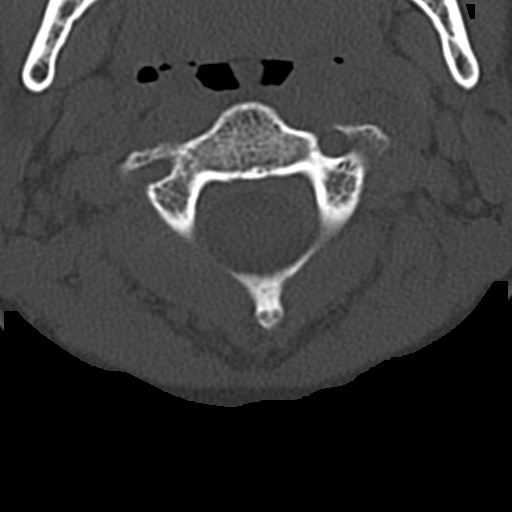

[10 of 33 positions shown; findings below may reference images not displayed]

FINDINGS: CT HEAD FINDINGS

Brain: Normal anatomic configuration. No abnormal intra or
extra-axial mass lesion or fluid collection. No abnormal mass effect
or midline shift. No evidence of acute intracranial hemorrhage or
infarct. Ventricular size is normal. Cerebellum unremarkable.

Vascular: Unremarkable

Skull: Intact

Sinuses/Orbits: Paranasal sinuses are clear. Orbits are
unremarkable.

Other: Mastoid air cells and middle ear cavities are clear. Mild
right supraorbital soft tissue swelling

CT CERVICAL SPINE FINDINGS

Alignment: Normal.

Skull base and vertebrae: No acute fracture. No primary bone lesion
or focal pathologic process.

Soft tissues and spinal canal: No prevertebral fluid or swelling. No
visible canal hematoma.

Disc levels: Vertebral body heights and intervertebral disc heights
are preserved. The prevertebral soft tissues are not thickened on
sagittal reformats. No significant canal stenosis or neuroforaminal
narrowing. No significant arthropathy.

Upper chest: Unremarkable

Other: None
IMPRESSION: Right supraorbital soft tissue swelling. No acute intracranial
injury. No calvarial fracture.

No acute fracture or listhesis of the cervical spine.
